# Patient Record
Sex: Female | Born: 1970 | Race: White | Hispanic: No | Marital: Married | Smoking: Never smoker
Health system: Southern US, Community
[De-identification: ages and names within clinical notes are randomized; demographics above are authoritative.]

---

## 2001-08-27 ENCOUNTER — Other Ambulatory Visit: Admission: RE | Admit: 2001-08-27 | Discharge: 2001-08-27 | Payer: Self-pay | Admitting: Gynecology

## 2003-07-17 ENCOUNTER — Encounter: Admission: RE | Admit: 2003-07-17 | Discharge: 2003-07-17 | Payer: Self-pay | Admitting: Obstetrics & Gynecology

## 2004-02-25 ENCOUNTER — Ambulatory Visit (HOSPITAL_COMMUNITY): Admission: AD | Admit: 2004-02-25 | Discharge: 2004-02-25 | Payer: Self-pay | Admitting: Obstetrics and Gynecology

## 2004-10-18 ENCOUNTER — Other Ambulatory Visit: Admission: RE | Admit: 2004-10-18 | Discharge: 2004-10-18 | Payer: Self-pay | Admitting: Obstetrics & Gynecology

## 2005-05-03 ENCOUNTER — Other Ambulatory Visit: Admission: RE | Admit: 2005-05-03 | Discharge: 2005-05-03 | Payer: Self-pay | Admitting: Obstetrics & Gynecology

## 2005-10-03 ENCOUNTER — Other Ambulatory Visit: Admission: RE | Admit: 2005-10-03 | Discharge: 2005-10-03 | Payer: Self-pay | Admitting: Obstetrics & Gynecology

## 2007-03-28 ENCOUNTER — Other Ambulatory Visit: Admission: RE | Admit: 2007-03-28 | Discharge: 2007-03-28 | Payer: Self-pay | Admitting: Obstetrics and Gynecology

## 2007-04-05 ENCOUNTER — Encounter: Admission: RE | Admit: 2007-04-05 | Discharge: 2007-04-05 | Payer: Self-pay | Admitting: Obstetrics and Gynecology

## 2007-06-29 ENCOUNTER — Encounter (INDEPENDENT_AMBULATORY_CARE_PROVIDER_SITE_OTHER): Payer: Self-pay | Admitting: Diagnostic Radiology

## 2007-06-29 ENCOUNTER — Encounter: Admission: RE | Admit: 2007-06-29 | Discharge: 2007-06-29 | Payer: Self-pay | Admitting: General Surgery

## 2008-02-12 ENCOUNTER — Other Ambulatory Visit: Admission: RE | Admit: 2008-02-12 | Discharge: 2008-02-12 | Payer: Self-pay | Admitting: Obstetrics and Gynecology

## 2008-08-05 ENCOUNTER — Other Ambulatory Visit: Admission: RE | Admit: 2008-08-05 | Discharge: 2008-08-05 | Payer: Self-pay | Admitting: Obstetrics and Gynecology

## 2008-11-25 ENCOUNTER — Emergency Department (HOSPITAL_COMMUNITY): Admission: EM | Admit: 2008-11-25 | Discharge: 2008-11-25 | Payer: Self-pay | Admitting: Family Medicine

## 2009-08-06 ENCOUNTER — Other Ambulatory Visit: Admission: RE | Admit: 2009-08-06 | Discharge: 2009-08-06 | Payer: Self-pay | Admitting: Obstetrics and Gynecology

## 2009-08-17 ENCOUNTER — Encounter: Admission: RE | Admit: 2009-08-17 | Discharge: 2009-08-17 | Payer: Self-pay | Admitting: Obstetrics and Gynecology

## 2010-02-16 ENCOUNTER — Other Ambulatory Visit: Admission: RE | Admit: 2010-02-16 | Discharge: 2010-02-16 | Payer: Self-pay | Admitting: Obstetrics and Gynecology

## 2010-07-11 ENCOUNTER — Encounter: Payer: Self-pay | Admitting: Obstetrics and Gynecology

## 2010-07-13 ENCOUNTER — Other Ambulatory Visit: Payer: Self-pay | Admitting: Surgery

## 2010-08-31 ENCOUNTER — Other Ambulatory Visit: Payer: Self-pay | Admitting: Obstetrics and Gynecology

## 2010-08-31 ENCOUNTER — Other Ambulatory Visit (HOSPITAL_COMMUNITY)
Admission: RE | Admit: 2010-08-31 | Discharge: 2010-08-31 | Disposition: A | Discharge status: Home / Self Care | Payer: BC Managed Care – PPO | Source: Ambulatory Visit | Attending: Obstetrics and Gynecology | Admitting: Obstetrics and Gynecology

## 2010-08-31 DIAGNOSIS — N6459 Other signs and symptoms in breast: Secondary | ICD-10-CM

## 2010-08-31 DIAGNOSIS — Z01419 Encounter for gynecological examination (general) (routine) without abnormal findings: Secondary | ICD-10-CM | POA: Insufficient documentation

## 2010-08-31 DIAGNOSIS — Z113 Encounter for screening for infections with a predominantly sexual mode of transmission: Secondary | ICD-10-CM | POA: Insufficient documentation

## 2010-09-07 ENCOUNTER — Ambulatory Visit
Admission: RE | Admit: 2010-09-07 | Discharge: 2010-09-07 | Disposition: A | Discharge status: Home / Self Care | Payer: BC Managed Care – PPO | Source: Ambulatory Visit | Attending: *Deleted | Admitting: *Deleted

## 2010-09-07 DIAGNOSIS — N6459 Other signs and symptoms in breast: Secondary | ICD-10-CM

## 2010-10-28 ENCOUNTER — Other Ambulatory Visit: Payer: Self-pay | Admitting: *Deleted

## 2010-10-28 ENCOUNTER — Other Ambulatory Visit: Payer: Self-pay | Admitting: Obstetrics and Gynecology

## 2010-10-28 DIAGNOSIS — N6459 Other signs and symptoms in breast: Secondary | ICD-10-CM

## 2010-11-05 NOTE — H&P (Signed)
NAME:  Theresa Flores, Theresa Flores                        ACCOUNT NO.:  [id number]   MEDICAL RECORD NO.:  [id number]                   PATIENT TYPE:  AMB   LOCATION:  [facility]                                 FACILITY:  [facility]   PHYSICIAN:  Dineen Kid. Rana Snare, M.D.                 DATE OF BIRTH:  September 04, 1970   DATE OF ADMISSION:  02/25/2004  DATE OF DISCHARGE:                                HISTORY & PHYSICAL   HISTORY OF PRESENT ILLNESS:  Theresa Flores is a 40 year old G1, P1, who was  seen in the office today by Dr. Lacretia Nicks. Varney Baas for LEEP for cervical  dysplasia.  This morning, she underwent a LEEP, went home without  complications, began developing vaginal bleeding, presented back to the  office this afternoon and had repeat cauterization of the cervix performed.  She did well through the evening, until about midnight; she passed a large  clot and began having heavy vaginal bleeding; she was also very weak and  orthostatic by the time she presented to triage, passing a moderate amount  of clots with a steady amount blood coming from the vagina and trickling  down her leg, in triage, unable to achieve hemostasis; plan to proceed back  to the operating room for further evaluation and re-cauterization of the  cervix.   MEDICATIONS:  Currently, she is on Depo-Lupron, a sleeping pill and Zyrtec.   ALLERGIES:  She is allergic to SULFA, PENICILLIN, ERYTHROMYCIN and BIAXIN,  which all cause hives and respiratory distress.   PAST MEDICAL HISTORY:  She denies any medical problems.   PHYSICAL EXAM:  VITAL SIGNS:  On physical exam, her blood pressure in triage  at 1:30 was 117/79, lying down, with a pulse of 59; when sitting, she is  116/91 with a pulse of 78 and standing, 126/75 with a pulse of 92,  consistent with orthostatic hypotension.  PELVIC:  On physical exam, she has a moderate amount of clots coming from  the vagina.  After placing a speculum, approximately 300 mL of clots were  evacuated from the  vagina.  An attempt to visualize the cervix was minimally  successful due to the large amount of bright red oozing from the cervix.  Attempts at swabbing this with Monsel-soaked 4 x 4's were unsuccessful.  Because of the moderate cervical bleeding, we plan to proceed to the  operating room.  I placed one 4 x 4 with Monsel-soaked against the cervix  and two 4 x 4's, packing the vagina, removed the speculum.   LABORATORY DATA:  A STAT hemoglobin was performed, hemoglobin was 12.5.   IMPRESSION AND PLAN:  Postoperative cervical bleeding from loop  electrosurgical excision procedure, unable to control this with conservative  measures in triage.  Plan cauterization of the cervix in the operating room.  The risks and benefits were discussed at length, which include, but not  limited to, risks of infection, bleeding, damage to  the cervix, possibility  of not being able to alleviate the bleeding or need for re-cauterization;  risks associated with anesthesia and blood loss; she does give her informed  consent.                                               Dineen Kid Rana Snare, M.D.    DCL/MEDQ  D:  02/25/2004  T:  02/25/2004  Job:  [id number]

## 2010-11-05 NOTE — Op Note (Signed)
NAME:  Theresa Flores, Theresa Flores                        ACCOUNT NO.:  [id number]   MEDICAL RECORD NO.:  [id number]                   PATIENT TYPE:  AMB   LOCATION:  [facility]                                 FACILITY:  [facility]   PHYSICIAN:  Dineen Kid. Rana Snare, M.D.                 DATE OF BIRTH:  December 29, 1970   DATE OF PROCEDURE:  02/25/2004  DATE OF DISCHARGE:                                 OPERATIVE REPORT   PREOPERATIVE DIAGNOSIS:  Postoperative loop electrical excision procedure  with heavy cervical bleeding.   POSTOPERATIVE DIAGNOSIS:  Postoperative loop electrical excision procedure  with heavy cervical bleeding.   PROCEDURE:  Cauterization of the cervix.   ANESTHESIA:  Monitored anesthetic care with local by infiltration.   SURGEON:  Dineen Kid. Rana Snare, M.D.   INDICATIONS:  [name] is a 40 year old G1, P1 who underwent a LEEP  procedure today, had postoperative bleeding which required a second attempt  at cauterization.  She presents tonight with heavy vaginal bleeding, passing  clots and with blood trickling down her leg.  She was orthostatic in triage  and attempts at achieving control of the cervical bleeding were unsuccessful  in triage with conservative measures.  Plan to proceed to the operating room  for cauterization.  Risks and benefits were discussed and informed consent  was obtained.   FINDINGS AT THE TIME OF SURGERY:  Moderate cervical bleeding in a  generalized fashion.  No obvious one identifiable source.   DESCRIPTION OF PROCEDURE:  After adequate anesthesia, the patient was placed  in the dorsal lithotomy position.  She was sterilely prepped and draped.  The bladder was sterilely drained with a red rubber catheter.  A Teflon-  coated Graves speculum was placed.  A smoke evacuator was also placed.  Clots were evacuated with 4 x 4s on the sponge stick.  The cervix was  infiltrated with 1% Xylocaine with 1:100,000 epinephrine, 15 mL used.  A  Teflon-coated tenaculum was placed on  the anterior lip of the cervix.  The  eschar for the Monsel's was removed with the 4 x 4 and the base of the  cervix was thoroughly cauterized with a Bovie.  This was performed until  good, thick eschar was across the entire cervical base and all areas of  cervical bleeding had been eradicated.  The tenaculum was removed from the  cervix and reexamination of the cervix even after wiping with a 4 x 4  revealed good hemostasis.  Some Monsel's solution was applied to the top of  the eschar and the speculum was then removed and the patient was transferred  to recovery room in stable condition.  The estimated blood loss during the  procedure was 25 mL.  Previously in triage, at least 300 mL of clots had  been evacuated for a total blood loss of 325 mL.   DISPOSITION:  The patient will be discharged home and will follow up  in the  office next week.  Told to return for increased pain, fever, or bleeding.                                               Dineen Kid Rana Snare, M.D.    DCL/MEDQ  D:  02/25/2004  T:  02/25/2004  Job:  [id number]

## 2012-01-18 ENCOUNTER — Other Ambulatory Visit: Payer: Self-pay | Admitting: Obstetrics and Gynecology

## 2012-01-18 ENCOUNTER — Other Ambulatory Visit (HOSPITAL_COMMUNITY)
Admission: RE | Admit: 2012-01-18 | Discharge: 2012-01-18 | Disposition: A | Discharge status: Home / Self Care | Payer: BC Managed Care – PPO | Source: Ambulatory Visit | Attending: Obstetrics and Gynecology | Admitting: Obstetrics and Gynecology

## 2012-01-18 DIAGNOSIS — R87810 Cervical high risk human papillomavirus (HPV) DNA test positive: Secondary | ICD-10-CM | POA: Insufficient documentation

## 2012-01-18 DIAGNOSIS — Z01419 Encounter for gynecological examination (general) (routine) without abnormal findings: Secondary | ICD-10-CM | POA: Insufficient documentation

## 2012-03-12 ENCOUNTER — Other Ambulatory Visit: Payer: Self-pay | Admitting: Obstetrics and Gynecology

## 2012-03-27 ENCOUNTER — Other Ambulatory Visit: Payer: Self-pay | Admitting: Obstetrics and Gynecology

## 2012-03-27 DIAGNOSIS — Z1231 Encounter for screening mammogram for malignant neoplasm of breast: Secondary | ICD-10-CM

## 2012-04-20 ENCOUNTER — Ambulatory Visit
Admission: RE | Admit: 2012-04-20 | Discharge: 2012-04-20 | Disposition: A | Discharge status: Home / Self Care | Payer: BC Managed Care – PPO | Source: Ambulatory Visit | Attending: Obstetrics and Gynecology | Admitting: Obstetrics and Gynecology

## 2012-04-20 DIAGNOSIS — Z1231 Encounter for screening mammogram for malignant neoplasm of breast: Secondary | ICD-10-CM

## 2013-04-10 ENCOUNTER — Other Ambulatory Visit: Payer: Self-pay

## 2013-04-10 DIAGNOSIS — Z1231 Encounter for screening mammogram for malignant neoplasm of breast: Secondary | ICD-10-CM

## 2013-04-29 ENCOUNTER — Ambulatory Visit: Payer: BC Managed Care – PPO

## 2013-04-29 ENCOUNTER — Other Ambulatory Visit (HOSPITAL_COMMUNITY)
Admission: RE | Admit: 2013-04-29 | Discharge: 2013-04-29 | Disposition: A | Discharge status: Home / Self Care | Payer: BC Managed Care – PPO | Source: Ambulatory Visit | Attending: Obstetrics and Gynecology | Admitting: Obstetrics and Gynecology

## 2013-04-29 ENCOUNTER — Other Ambulatory Visit: Payer: Self-pay | Admitting: Obstetrics and Gynecology

## 2013-04-29 DIAGNOSIS — Z01419 Encounter for gynecological examination (general) (routine) without abnormal findings: Secondary | ICD-10-CM | POA: Insufficient documentation

## 2013-04-29 DIAGNOSIS — Z1151 Encounter for screening for human papillomavirus (HPV): Secondary | ICD-10-CM | POA: Insufficient documentation

## 2013-05-23 ENCOUNTER — Ambulatory Visit: Payer: BC Managed Care – PPO

## 2013-07-12 ENCOUNTER — Ambulatory Visit: Payer: BC Managed Care – PPO

## 2013-07-31 ENCOUNTER — Ambulatory Visit: Payer: BC Managed Care – PPO

## 2014-05-23 ENCOUNTER — Ambulatory Visit
Admission: RE | Admit: 2014-05-23 | Discharge: 2014-05-23 | Disposition: A | Discharge status: Home / Self Care | Payer: BC Managed Care – PPO | Source: Ambulatory Visit

## 2014-05-23 DIAGNOSIS — Z1231 Encounter for screening mammogram for malignant neoplasm of breast: Secondary | ICD-10-CM

## 2014-05-23 IMAGING — MG MM SCREENING BREAST TOMO BILATERAL
8 series · 9 of 24 positions shown · non-contrast
Comparison: Previous exam(s).

CLINICAL DATA: Screening.

EXAM:
DIGITAL SCREENING BILATERAL MAMMOGRAM WITH 3D TOMO WITH CAD

[L MLO]
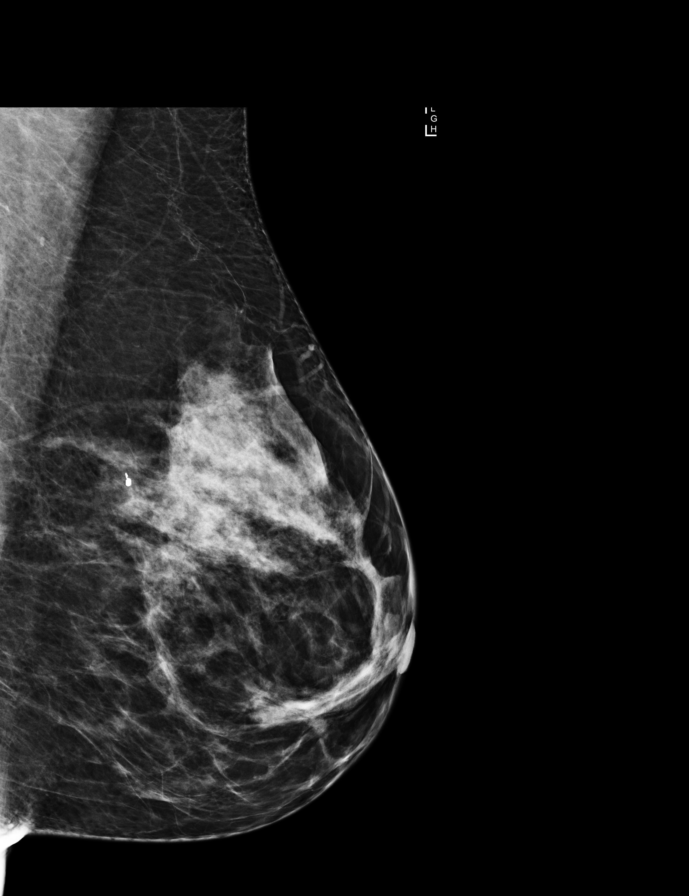

[L CC]
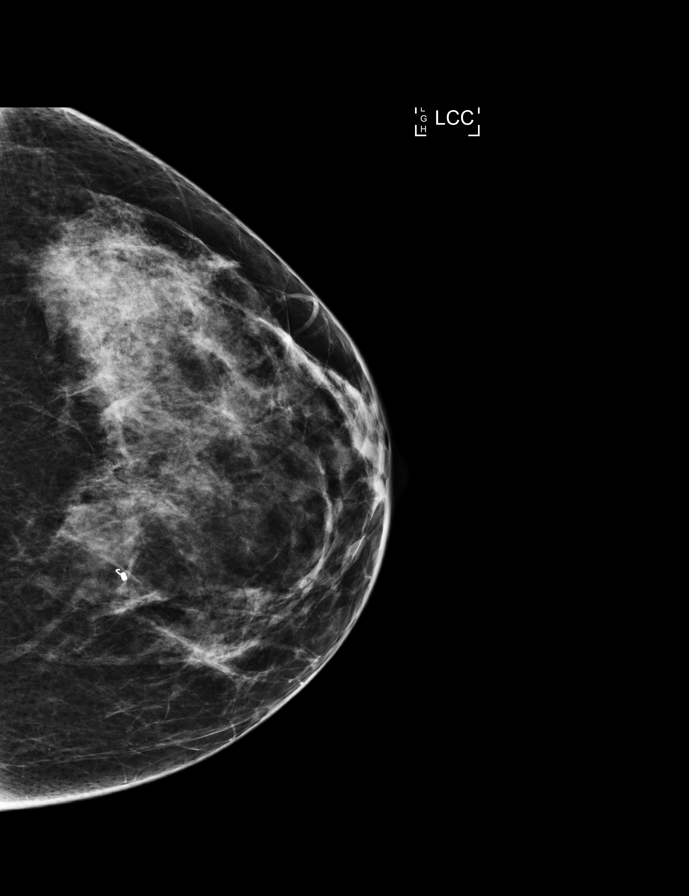

[R CC]
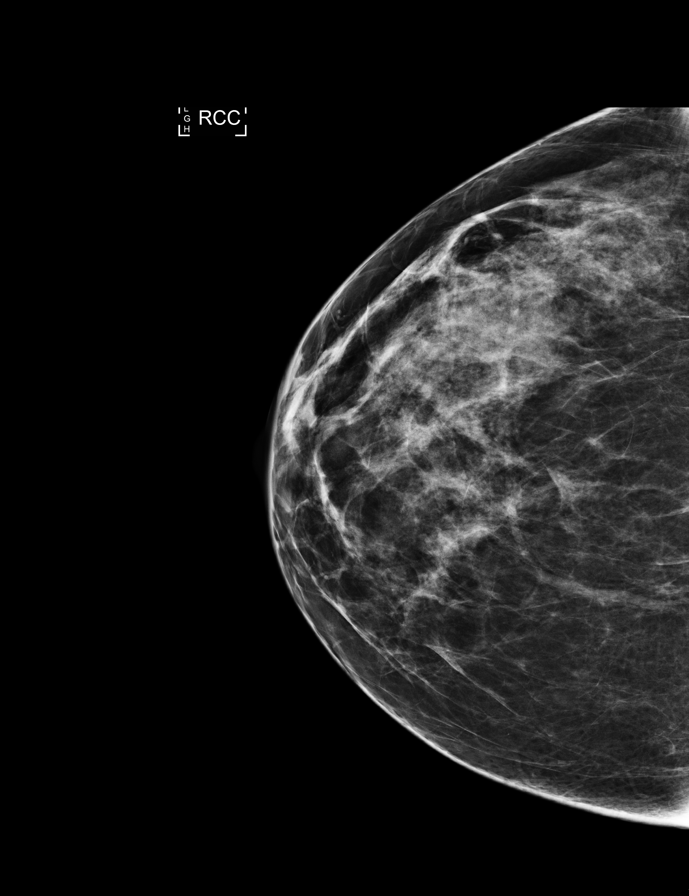

[R MLO]
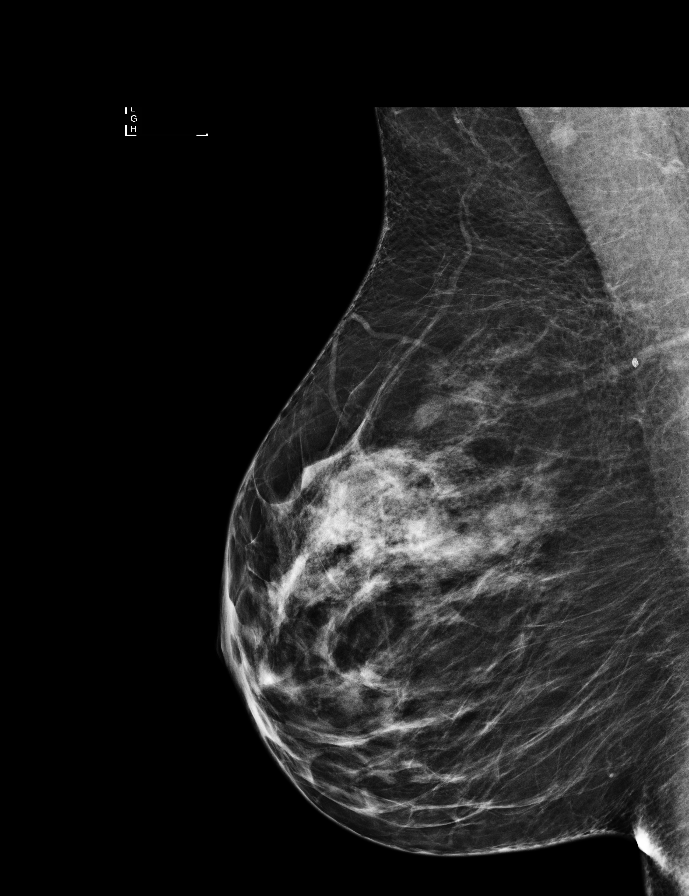

[R MLO tomo · 2 of 79 frames shown]
[frame 26/79]
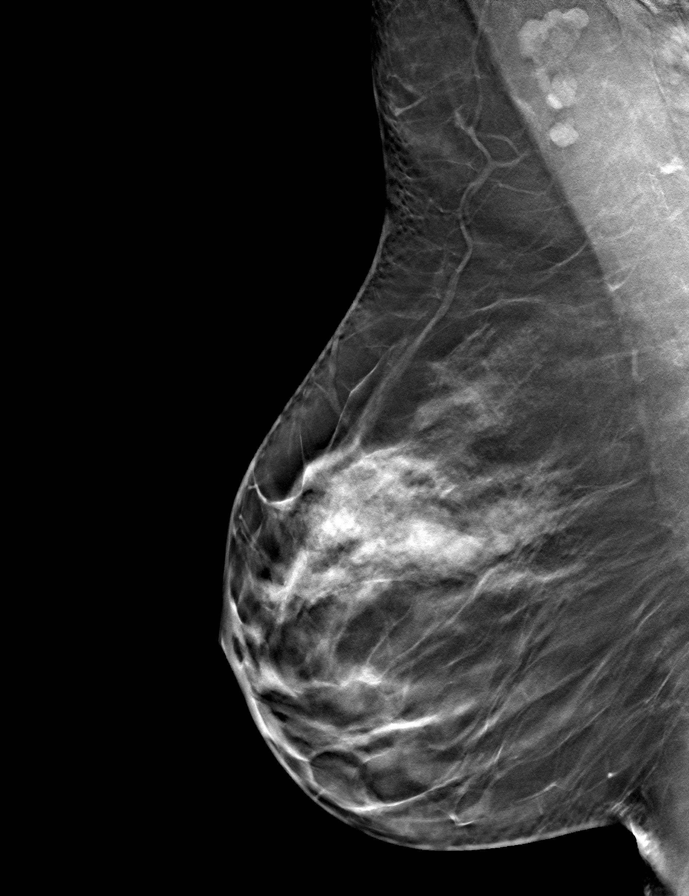
[frame 40/79]
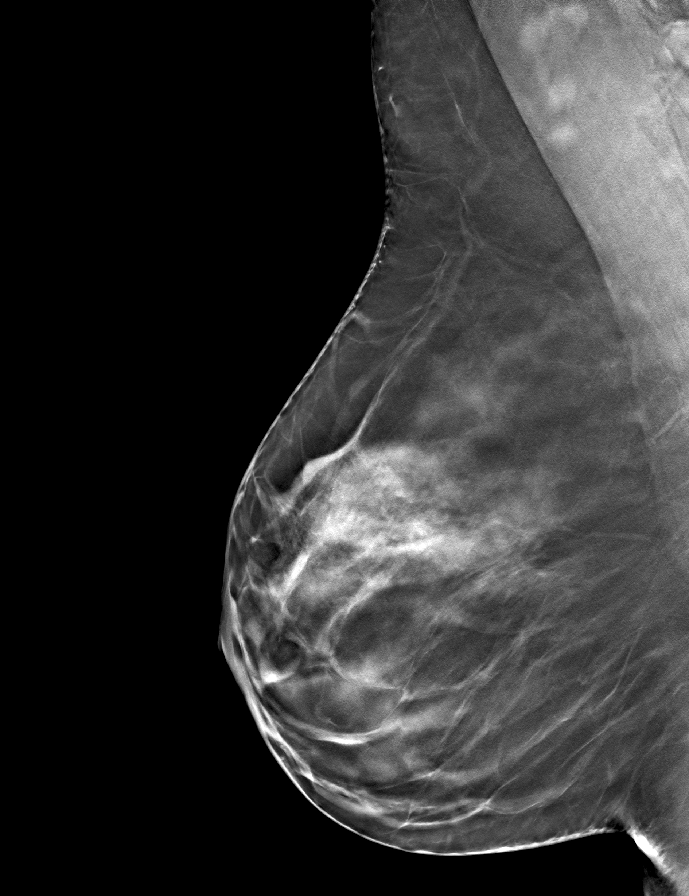

[L MLO tomo · tomo slice 38/75.0]
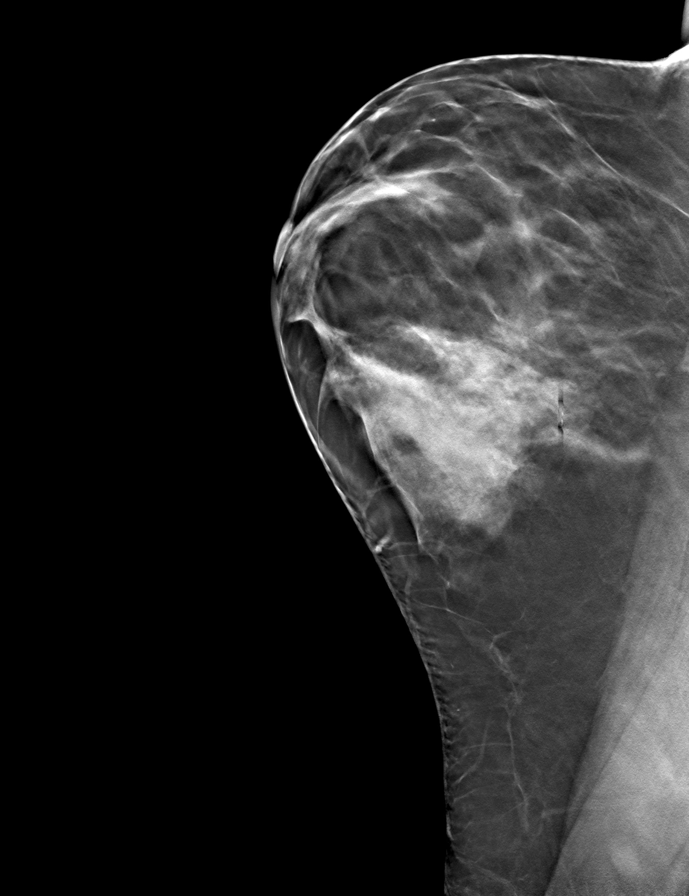

[R CC tomo · tomo slice 36/71.0]
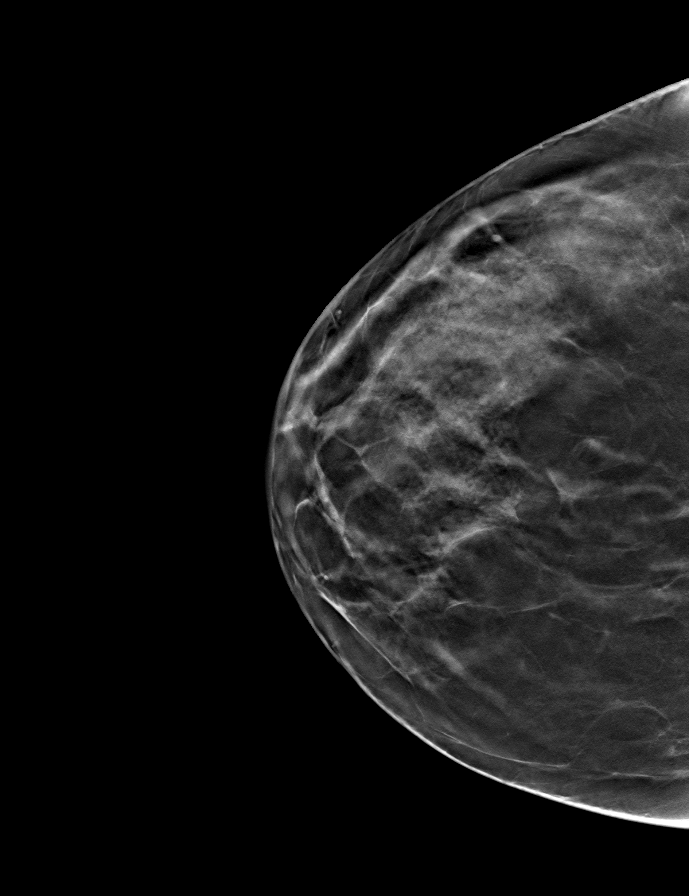

[L CC tomo · tomo slice 33/66.0]
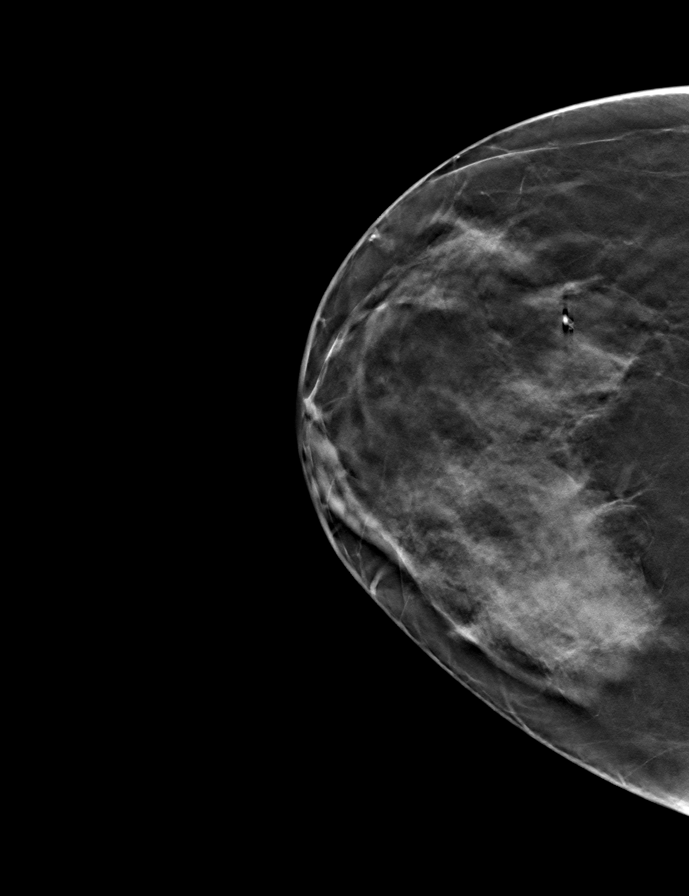

[9 of 24 positions shown; findings below may reference images not displayed]

ACR Breast Density Category b: There are scattered areas of
fibroglandular density.
FINDINGS: There are no findings suspicious for malignancy. Images were
processed with CAD.
IMPRESSION: No mammographic evidence of malignancy. A result letter of this
screening mammogram will be mailed directly to the patient.

RECOMMENDATION:
Screening mammogram in one year. (Code:[F0])

BI-RADS CATEGORY  1: Negative.

## 2015-07-22 ENCOUNTER — Other Ambulatory Visit (HOSPITAL_COMMUNITY)
Admission: RE | Admit: 2015-07-22 | Discharge: 2015-07-22 | Disposition: A | Discharge status: Home / Self Care | Payer: 59 | Source: Ambulatory Visit | Attending: Obstetrics and Gynecology | Admitting: Obstetrics and Gynecology

## 2015-07-22 ENCOUNTER — Other Ambulatory Visit: Payer: Self-pay | Admitting: Obstetrics and Gynecology

## 2015-07-22 DIAGNOSIS — Z1151 Encounter for screening for human papillomavirus (HPV): Secondary | ICD-10-CM | POA: Insufficient documentation

## 2015-07-22 DIAGNOSIS — Z01411 Encounter for gynecological examination (general) (routine) with abnormal findings: Secondary | ICD-10-CM | POA: Diagnosis present

## 2015-07-24 LAB — CYTOLOGY - PAP

## 2015-08-25 ENCOUNTER — Other Ambulatory Visit: Payer: Self-pay | Admitting: Obstetrics and Gynecology

## 2016-06-28 ENCOUNTER — Encounter (HOSPITAL_COMMUNITY): Payer: Self-pay | Admitting: *Deleted

## 2016-06-28 ENCOUNTER — Ambulatory Visit (HOSPITAL_COMMUNITY)
Admission: EM | Admit: 2016-06-28 | Discharge: 2016-06-28 | Disposition: A | Discharge status: Home / Self Care | Payer: 59 | Attending: Family Medicine | Admitting: Family Medicine

## 2016-06-28 DIAGNOSIS — R69 Illness, unspecified: Secondary | ICD-10-CM | POA: Diagnosis not present

## 2016-06-28 DIAGNOSIS — J111 Influenza due to unidentified influenza virus with other respiratory manifestations: Secondary | ICD-10-CM

## 2016-06-28 MED ORDER — IPRATROPIUM BROMIDE 0.06 % NA SOLN: 2.0000 | Freq: Four times a day (QID) | NASAL | 1 refills | Status: AC

## 2016-06-28 MED ORDER — HYDROCOD POLST-CPM POLST ER 10-8 MG/5ML PO SUER: 5.0000 mL | Freq: Two times a day (BID) | ORAL | 1 refills | Status: AC | PRN

## 2016-06-28 NOTE — ED Provider Notes (Signed)
[facility]    CSN: [id number] Arrival date & time: 06/28/16  1314     History   Chief Complaint Chief Complaint  Patient presents with  . Cough    HPI Theresa Flores is a 46 y.o. female.   The history is provided by the patient.  Cough  Cough characteristics:  Non-productive, dry and hacking Severity:  Moderate Onset quality:  Sudden Duration:  1 day Chronicity:  New Smoker: no   Context: sick contacts and upper respiratory infection   Relieved by:  None tried Worsened by:  Nothing Ineffective treatments:  None tried Associated symptoms: fever, rhinorrhea and sore throat     History reviewed. No pertinent past medical history.  There are no active problems to display for this patient.   History reviewed. No pertinent surgical history.  OB History    No data available       Home Medications    Prior to Admission medications   Medication Sig Start Date End Date Taking? Authorizing Provider  Dextromethorphan Polistirex (DELSYM PO) Take by mouth.   Yes Historical Provider, MD  Ibuprofen (ADVIL PO) Take by mouth.   Yes Historical Provider, MD  chlorpheniramine-HYDROcodone (TUSSIONEX PENNKINETIC ER) 10-8 MG/5ML SUER Take 5 mLs by mouth every 12 (twelve) hours as needed for cough. 06/28/16   Billy Fischer, MD  ipratropium (ATROVENT) 0.06 % nasal spray Place 2 sprays into both nostrils 4 (four) times daily. 06/28/16   Billy Fischer, MD    Family History History reviewed. No pertinent family history.  Social History Social History  Substance Use Topics  . Smoking status: Never Smoker  . Smokeless tobacco: Never Used  . Alcohol use Yes     Allergies   Penicillins and Sulfa antibiotics   Review of Systems Review of Systems  Constitutional: Positive for fever.  HENT: Positive for congestion, postnasal drip, rhinorrhea and sore throat.   Respiratory: Positive for cough.      Physical Exam Triage Vital Signs ED Triage Vitals  Enc  Vitals Group     BP 06/28/16 1443 118/71     Pulse Rate 06/28/16 1443 75     Resp 06/28/16 1443 18     Temp 06/28/16 1443 98.6 F (37 C)     Temp Source 06/28/16 1443 Oral     SpO2 06/28/16 1443 100 %     Weight --      Height --      Head Circumference --      Peak Flow --      Pain Score 06/28/16 1446 2     Pain Loc --      Pain Edu? --      Excl. in [place]? --    No data found.   Updated Vital Signs BP 118/71 (BP Location: Right Arm)   Pulse 75   Temp 98.6 F (37 C) (Oral)   Resp 18   LMP 06/28/2016   SpO2 100%   Visual Acuity Right Eye Distance:   Left Eye Distance:   Bilateral Distance:    Right Eye Near:   Left Eye Near:    Bilateral Near:     Physical Exam  Constitutional: She is oriented to person, place, and time. She appears well-developed and well-nourished. No distress.  HENT:  Right Ear: External ear normal.  Left Ear: External ear normal.  Nose: Nose normal.  Mouth/Throat: Oropharynx is clear and moist.  Neck: Normal range of motion.  Cardiovascular: Normal rate,  regular rhythm, normal heart sounds and intact distal pulses.   Pulmonary/Chest: Effort normal and breath sounds normal.  Lymphadenopathy:    She has no cervical adenopathy.  Neurological: She is alert and oriented to person, place, and time.  Skin: Skin is warm and dry.  Nursing note and vitals reviewed.    UC Treatments / Results  Labs (all labs ordered are listed, but only abnormal results are displayed) Labs Reviewed - No data to display  EKG  EKG Interpretation None       Radiology No results found.  Procedures Procedures (including critical care time)  Medications Ordered in UC Medications - No data to display   Initial Impression / Assessment and Plan / UC Course  I have reviewed the triage vital signs and the nursing notes.  Pertinent labs & imaging results that were available during my care of the patient were reviewed by me and considered in my medical  decision making (see chart for details).       Final Clinical Impressions(s) / UC Diagnoses   Final diagnoses:  Influenza-like illness    New Prescriptions Discharge Medication List as of 06/28/2016  3:34 PM    START taking these medications   Details  chlorpheniramine-HYDROcodone (TUSSIONEX PENNKINETIC ER) 10-8 MG/5ML SUER Take 5 mLs by mouth every 12 (twelve) hours as needed for cough., Starting Tue 06/28/2016, Print    ipratropium (ATROVENT) 0.06 % nasal spray Place 2 sprays into both nostrils 4 (four) times daily., Starting Tue 06/28/2016, Print         Billy Fischer, MD 07/17/16 2135

## 2016-06-28 NOTE — ED Triage Notes (Signed)
sorethroat       Cough       Head is   Stuffy      onnset   Of   Symptoms   Several  Days  Ago      -  Pt  Ambulated  To  Room  With a  Steady fluid     Gait         Pt is  asiiting  Upright  alsrt  And  Oriented  And  Is  Masked  And  Is  In no  Acute  Distress

## 2016-11-15 ENCOUNTER — Other Ambulatory Visit: Payer: Self-pay | Admitting: Obstetrics & Gynecology

## 2016-11-15 DIAGNOSIS — Z803 Family history of malignant neoplasm of breast: Secondary | ICD-10-CM

## 2017-01-16 ENCOUNTER — Ambulatory Visit
Admission: RE | Admit: 2017-01-16 | Discharge: 2017-01-16 | Disposition: A | Discharge status: Home / Self Care | Payer: 59 | Source: Ambulatory Visit | Attending: Obstetrics & Gynecology | Admitting: Obstetrics & Gynecology

## 2017-01-16 DIAGNOSIS — Z803 Family history of malignant neoplasm of breast: Secondary | ICD-10-CM

## 2017-01-16 MED ORDER — GADOBENATE DIMEGLUMINE 529 MG/ML IV SOLN
15.0000 mL | Freq: Once | INTRAVENOUS | Status: AC | PRN
  Administered 2017-01-16: 15 mL via INTRAVENOUS

## 2017-12-12 DIAGNOSIS — N951 Menopausal and female climacteric states: Secondary | ICD-10-CM | POA: Diagnosis not present

## 2017-12-12 DIAGNOSIS — Z01419 Encounter for gynecological examination (general) (routine) without abnormal findings: Secondary | ICD-10-CM | POA: Diagnosis not present

## 2017-12-12 DIAGNOSIS — Z800 Family history of malignant neoplasm of digestive organs: Secondary | ICD-10-CM | POA: Diagnosis not present

## 2017-12-12 DIAGNOSIS — Z1231 Encounter for screening mammogram for malignant neoplasm of breast: Secondary | ICD-10-CM | POA: Diagnosis not present

## 2017-12-12 DIAGNOSIS — Z803 Family history of malignant neoplasm of breast: Secondary | ICD-10-CM | POA: Diagnosis not present

## 2017-12-12 DIAGNOSIS — Z801 Family history of malignant neoplasm of trachea, bronchus and lung: Secondary | ICD-10-CM | POA: Diagnosis not present

## 2018-01-30 DIAGNOSIS — Z809 Family history of malignant neoplasm, unspecified: Secondary | ICD-10-CM | POA: Diagnosis not present

## 2018-04-13 DIAGNOSIS — E039 Hypothyroidism, unspecified: Secondary | ICD-10-CM | POA: Diagnosis not present

## 2018-04-18 DIAGNOSIS — E039 Hypothyroidism, unspecified: Secondary | ICD-10-CM | POA: Diagnosis not present

## 2018-04-18 DIAGNOSIS — Z23 Encounter for immunization: Secondary | ICD-10-CM | POA: Diagnosis not present

## 2018-06-04 DIAGNOSIS — E039 Hypothyroidism, unspecified: Secondary | ICD-10-CM | POA: Diagnosis not present

## 2018-06-08 DIAGNOSIS — R635 Abnormal weight gain: Secondary | ICD-10-CM | POA: Diagnosis not present

## 2018-06-08 DIAGNOSIS — E663 Overweight: Secondary | ICD-10-CM | POA: Diagnosis not present

## 2018-07-16 DIAGNOSIS — Z0000 Encounter for general adult medical examination without abnormal findings: Secondary | ICD-10-CM | POA: Diagnosis not present

## 2018-07-16 DIAGNOSIS — E039 Hypothyroidism, unspecified: Secondary | ICD-10-CM | POA: Diagnosis not present

## 2019-04-11 ENCOUNTER — Other Ambulatory Visit: Payer: Self-pay | Admitting: Obstetrics & Gynecology

## 2019-04-11 DIAGNOSIS — R928 Other abnormal and inconclusive findings on diagnostic imaging of breast: Secondary | ICD-10-CM

## 2019-04-18 ENCOUNTER — Ambulatory Visit
Admission: RE | Admit: 2019-04-18 | Discharge: 2019-04-18 | Disposition: A | Discharge status: Home / Self Care | Payer: 59 | Source: Ambulatory Visit | Attending: Obstetrics & Gynecology | Admitting: Obstetrics & Gynecology

## 2019-04-18 ENCOUNTER — Other Ambulatory Visit: Payer: Self-pay

## 2019-04-18 ENCOUNTER — Ambulatory Visit: Payer: 59

## 2019-04-18 DIAGNOSIS — R928 Other abnormal and inconclusive findings on diagnostic imaging of breast: Secondary | ICD-10-CM

## 2020-11-12 ENCOUNTER — Other Ambulatory Visit: Payer: Self-pay | Admitting: Internal Medicine

## 2020-11-12 DIAGNOSIS — E785 Hyperlipidemia, unspecified: Secondary | ICD-10-CM

## 2021-01-12 NOTE — Progress Notes (Signed)
Office Visit Note  Patient: Theresa Flores             Date of Birth: [birth year]           MRN: [id number]             PCP: Michael Boston, MD Referring: Delrae Rend, MD Visit Date: 01/25/2021 Occupation: '@GUAROCC'$ @  Subjective:  Positive SSA, dry mouth and dry eyes.   History of Present Illness: Theresa Flores is a 50 y.o. female seen in consultation per request of Dr. Buddy Duty.  According the patient she has been under care of Dr. Buddy Duty for at least last 10 years for thyroiditis.  She states about 7 years ago she started having some discomfort in her hands and at the time she was evaluated by me.  According to patient I did x-rays at the time which were unremarkable.  She states in the last 1-1/2 years she has been experiencing increased fatigue, weight gain and her thyroid functions have been fluctuating.  In May 2022 she acquired COVID-19 infection and she has been experiencing increased fatigue since then.  She is also had history of dry mouth and dry eyes for many years which has gotten worse after the COVID-19 infection.  She was evaluated by her GYN and was given estrogen patch and progesterone pills which she could not tolerate and did not notice any benefit from.  She was also evaluated by her PCP in March 2022.  At the time she had extensive labs which were consistent with positive SSA antibody, elevated TSH.  She states her TSH is normal now after her thyroid disease has been treated.  She continues to have dry mouth and dry eyes.  She also has some stiffness in her hands which she describes over the [facility] joints.  There is no history of oral ulcers, nasal ulcers, malar rash, photosensitivity, lymphadenopathy or inflammatory arthritis.  She gives history of Raynaud's phenomenon when she is very cold.  She is gravida 4, para 1, miscarriages 3 related to endometriosis.  There is no family history of autoimmune disease.  Activities of Daily Living:  Patient reports morning  stiffness for 30 minutes.   Patient Denies nocturnal pain.  Difficulty dressing/grooming: Denies Difficulty climbing stairs: Denies Difficulty getting out of chair: Denies Difficulty using hands for taps, buttons, cutlery, and/or writing: Denies  Review of Systems  Constitutional:  Positive for fatigue.  HENT:  Positive for mouth dryness. Negative for mouth sores and nose dryness.   Eyes:  Positive for dryness. Negative for pain and itching.  Respiratory:  Negative for shortness of breath and difficulty breathing.   Cardiovascular:  Negative for chest pain and palpitations.  Gastrointestinal:  Negative for blood in stool, constipation and diarrhea.  Endocrine: Negative for increased urination.  Genitourinary:  Negative for difficulty urinating.  Musculoskeletal:  Positive for joint pain, joint pain and morning stiffness. Negative for joint swelling, myalgias, muscle tenderness and myalgias.  Skin:  Positive for color change. Negative for rash, redness and sensitivity to sunlight.  Allergic/Immunologic: Negative for susceptible to infections.  Neurological:  Positive for numbness. Negative for dizziness, headaches, memory loss and weakness.  Hematological:  Negative for bruising/bleeding tendency and swollen glands.  Psychiatric/Behavioral:  Positive for depressed mood. Negative for confusion and sleep disturbance. The patient is nervous/anxious.    PMFS History:  There are no problems to display for this patient.   History reviewed. No pertinent past medical history.  Family History  Problem Relation  Age of Onset   Breast cancer Mother    Osteoporosis Mother    Thyroid disease Mother    Raynaud syndrome Mother    Heart disease Father    Healthy Son    Past Surgical History:  Procedure Laterality Date   HERNIA REPAIR     1990s   Social History   Social History Narrative   Not on file   Immunization History  Administered Date(s) Administered   PFIZER(Purple  Top)SARS-COV-2 Vaccination 08/14/2019, 09/04/2019, 03/29/2020     Objective: Vital Signs: BP 116/83 (BP Location: Right Arm, Patient Position: Sitting, Cuff Size: Normal)   Pulse 83   Resp 14   Ht 5' 5.5" (1.664 m)   Wt 161 lb 3.2 oz (73.1 kg)   LMP 06/28/2016   BMI 26.42 kg/m    Physical Exam Vitals and nursing note reviewed.  Constitutional:      Appearance: She is well-developed.  HENT:     Head: Normocephalic and atraumatic.  Eyes:     Conjunctiva/sclera: Conjunctivae normal.  Cardiovascular:     Rate and Rhythm: Normal rate and regular rhythm.     Heart sounds: Normal heart sounds.  Pulmonary:     Effort: Pulmonary effort is normal.     Breath sounds: Normal breath sounds.  Abdominal:     General: Bowel sounds are normal.     Palpations: Abdomen is soft.  Musculoskeletal:     Cervical back: Normal range of motion.  Lymphadenopathy:     Cervical: No cervical adenopathy.  Skin:    General: Skin is warm and dry.     Capillary Refill: Capillary refill takes less than 2 seconds.     Comments: No sclerodactyly, telangiectasias or defect capillary changes were noted.  Neurological:     Mental Status: She is alert and oriented to person, place, and time.  Psychiatric:        Behavior: Behavior normal.    Musculoskeletal Exam: Good range of motion.  Shoulder joints, elbow joints, wrist joints, MCPs PIPs and DIPs with good range of motion.  She had left CMC discomfort and thickening.  Hip joints, knee joints, ankles, MTPs and PIPs with good range of motion.  She had bilateral PIP and DIP thickening with no synovitis.  CDAI Exam: CDAI Score: -- Patient Global: --; Provider Global: -- Swollen: --; Tender: -- Joint Exam 01/25/2021   No joint exam has been documented for this visit   There is currently no information documented on the homunculus. Go to the Rheumatology activity and complete the homunculus joint exam.  Investigation: No additional  findings.  Imaging: No results found.  Recent Labs: No results found for: WBC, HGB, PLT, NA, K, CL, CO2, GLUCOSE, BUN, CREATININE, BILITOT, ALKPHOS, AST, ALT, PROT, ALBUMIN, CALCIUM, [place]   September 14, 2020 labs from Dr. Carver Fila as CMP LDH 283, triglycerides 160, CBC normal, TSH 6.56, hemoglobin A1c 5.4, estradiol normal, vitamin D 29.6, ANA positive no titer given, (dsDNA, RNP, Smith, SSB negative), SSA positive  Speciality Comments: No specialty comments available.  Procedures:  No procedures performed Allergies: Hydrocodone, Penicillins, and Sulfa antibiotics   Assessment / Plan:     Visit Diagnoses: Dry mouth -patient has been complaining of dry mouth and dry eyes for the last few years.  Her symptoms have been worse after she developed COVID-19 infection in May 2022.  There is no history of lymphadenopathy or parotid swelling.  Different treatment options and their side effects were discussed.  Over-the-counter products were discussed.  I will also place her on pilocarpine 5 mg p.o. 3 times daily.  Side effects of pilocarpine were discussed.  She was some labs with her today which showed positive ANA and positive SSA antibody.  We will obtain AVISE labs.  (Previous pt. August 2016: ANA-, RF-, anti-CCP-, complements WNL, anticardiolipin ab-, LA-, beta-2 glycoprotein-, dsDNA 5 and Scl-70: 1.4. ANA+)  Dry eyes-she also complains of dry eyes.  Over-the-counter products were discussed.  Raynaud's syndrome without gangrene - Previously mild.  No features of scleroderma in 2016.  She had no sclerodactyly, telangiectasias or nailbed capillary changes.  She has mild Raynaud's during the winter months.  Keeping core temperature warm was discussed.  Pain in both hands- Previous u/s negative for synovitis.  Referred to Dr. 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  Patient states that she decided against surgery.  She continues to have discomfort in her bilateral  hands especially her left CMC joint.  I offered left CMC brace which she declined.  Joint protection muscle strengthening was discussed.  A handout on hand muscle strengthening exercises were discussed.  Dyslipidemia  Hashimoto's thyroiditis-followed by Dr. Buddy Duty.  Reactive depression and  History of endometriosis  Orders: No orders of the defined types were placed in this encounter.  Meds ordered this encounter  Medications   pilocarpine (SALAGEN) 5 MG tablet    Sig: Take 1 tablet (5 mg total) by mouth 3 (three) times daily as needed.    Dispense:  90 tablet    Refill:  2      Follow-Up Instructions: Return for Osteoarthritis, sicca.   Bo Merino, MD  Note - This record has been created using Editor, commissioning.  Chart creation errors have been sought, but may not always  have been located. Such creation errors do not reflect on  the standard of medical care.

## 2021-01-25 ENCOUNTER — Ambulatory Visit (INDEPENDENT_AMBULATORY_CARE_PROVIDER_SITE_OTHER): Payer: 59 | Admitting: Rheumatology

## 2021-01-25 ENCOUNTER — Other Ambulatory Visit: Payer: Self-pay

## 2021-01-25 ENCOUNTER — Encounter: Payer: Self-pay | Admitting: Rheumatology

## 2021-01-25 VITALS — BP 116/83 | HR 83 | Resp 14 | Ht 65.5 in | Wt 161.2 lb

## 2021-01-25 DIAGNOSIS — E063 Autoimmune thyroiditis: Secondary | ICD-10-CM

## 2021-01-25 DIAGNOSIS — R682 Dry mouth, unspecified: Secondary | ICD-10-CM

## 2021-01-25 DIAGNOSIS — I7300 Raynaud's syndrome without gangrene: Secondary | ICD-10-CM | POA: Diagnosis not present

## 2021-01-25 DIAGNOSIS — M79642 Pain in left hand: Secondary | ICD-10-CM

## 2021-01-25 DIAGNOSIS — H04123 Dry eye syndrome of bilateral lacrimal glands: Secondary | ICD-10-CM | POA: Diagnosis not present

## 2021-01-25 DIAGNOSIS — Z8742 Personal history of other diseases of the female genital tract: Secondary | ICD-10-CM

## 2021-01-25 DIAGNOSIS — M2550 Pain in unspecified joint: Secondary | ICD-10-CM

## 2021-01-25 DIAGNOSIS — E785 Hyperlipidemia, unspecified: Secondary | ICD-10-CM

## 2021-01-25 DIAGNOSIS — F329 Major depressive disorder, single episode, unspecified: Secondary | ICD-10-CM

## 2021-01-25 DIAGNOSIS — M79641 Pain in right hand: Secondary | ICD-10-CM

## 2021-01-25 MED ORDER — PILOCARPINE HCL 5 MG PO TABS: 5.0000 mg | ORAL_TABLET | Freq: Three times a day (TID) | ORAL | 2 refills | Status: DC | PRN

## 2021-01-25 NOTE — Patient Instructions (Signed)
Hand Exercises Hand exercises can be helpful for almost anyone. These exercises can strengthen the hands, improve flexibility and movement, and increase blood flow to the hands. These results can make work and daily tasks easier. Hand exercises can be especially helpful for people who have joint pain from arthritis or have nerve damage from overuse (carpal tunnel syndrome). These exercises can also help people who have injured a hand. Exercises Most of these hand exercises are gentle stretching and motion exercises. It is usually safe to do them often throughout the day. Warming up your hands before exercise may help to reduce stiffness. You can do this with gentle massage orby placing your hands in warm water for 10-15 minutes. It is normal to feel some stretching, pulling, tightness, or mild discomfort as you begin new exercises. This will gradually improve. Stop an exercise right away if you feel sudden, severe pain or your pain gets worse. Ask your healthcare provider which exercises are best for you. Knuckle bend or "claw" fist Stand or sit with your arm, hand, and all five fingers pointed straight up. Make sure to keep your wrist straight during the exercise. Gently bend your fingers down toward your palm until the tips of your fingers are touching the top of your palm. Keep your big knuckle straight and just bend the small knuckles in your fingers. Hold this position for __________ seconds. Straighten (extend) your fingers back to the starting position. Repeat this exercise 5-10 times with each hand. Full finger fist Stand or sit with your arm, hand, and all five fingers pointed straight up. Make sure to keep your wrist straight during the exercise. Gently bend your fingers into your palm until the tips of your fingers are touching the middle of your palm. Hold this position for __________ seconds. Extend your fingers back to the starting position, stretching every joint fully. Repeat this  exercise 5-10 times with each hand. Straight fist Stand or sit with your arm, hand, and all five fingers pointed straight up. Make sure to keep your wrist straight during the exercise. Gently bend your fingers at the big knuckle, where your fingers meet your hand, and the middle knuckle. Keep the knuckle at the tips of your fingers straight and try to touch the bottom of your palm. Hold this position for __________ seconds. Extend your fingers back to the starting position, stretching every joint fully. Repeat this exercise 5-10 times with each hand. Tabletop Stand or sit with your arm, hand, and all five fingers pointed straight up. Make sure to keep your wrist straight during the exercise. Gently bend your fingers at the big knuckle, where your fingers meet your hand, as far down as you can while keeping the small knuckles in your fingers straight. Think of forming a tabletop with your fingers. Hold this position for __________ seconds. Extend your fingers back to the starting position, stretching every joint fully. Repeat this exercise 5-10 times with each hand. Finger spread Place your hand flat on a table with your palm facing down. Make sure your wrist stays straight as you do this exercise. Spread your fingers and thumb apart from each other as far as you can until you feel a gentle stretch. Hold this position for __________ seconds. Bring your fingers and thumb tight together again. Hold this position for __________ seconds. Repeat this exercise 5-10 times with each hand. Making circles Stand or sit with your arm, hand, and all five fingers pointed straight up. Make sure to keep your   wrist straight during the exercise. Make a circle by touching the tip of your thumb to the tip of your index finger. Hold for __________ seconds. Then open your hand wide. Repeat this motion with your thumb and each finger on your hand. Repeat this exercise 5-10 times with each hand. Thumb motion Sit  with your forearm resting on a table and your wrist straight. Your thumb should be facing up toward the ceiling. Keep your fingers relaxed as you move your thumb. Lift your thumb up as high as you can toward the ceiling. Hold for __________ seconds. Bend your thumb across your palm as far as you can, reaching the tip of your thumb for the small finger (pinkie) side of your palm. Hold for __________ seconds. Repeat this exercise 5-10 times with each hand. Grip strengthening  Hold a stress ball or other soft ball in the middle of your hand. Slowly increase the pressure, squeezing the ball as much as you can without causing pain. Think of bringing the tips of your fingers into the middle of your palm. All of your finger joints should bend when doing this exercise. Hold your squeeze for __________ seconds, then relax. Repeat this exercise 5-10 times with each hand. Contact a health care provider if: Your hand pain or discomfort gets much worse when you do an exercise. Your hand pain or discomfort does not improve within 2 hours after you exercise. If you have any of these problems, stop doing these exercises right away. Do not do them again unless your health care provider says that you can. Get help right away if: You develop sudden, severe hand pain or swelling. If this happens, stop doing these exercises right away. Do not do them again unless your health care provider says that you can. This information is not intended to replace advice given to you by your health care provider. Make sure you discuss any questions you have with your healthcare provider. Document Revised: 09/27/2018 Document Reviewed: 06/07/2018 Elsevier Patient Education  2022 [employer]  

## 2021-01-27 ENCOUNTER — Telehealth: Payer: Self-pay

## 2021-01-27 NOTE — Telephone Encounter (Signed)
Patient left a voicemail checking the status of her prescription of Salagen 5 mg tablet that Dr. Estanislado Pandy was calling into [facility].  Patient states she hasn't been notified by the pharmacy that they received it.

## 2021-01-27 NOTE — Telephone Encounter (Signed)
Attempted to contact the patient and left message to advise patient prescription was sent to the pharmacy today.

## 2021-02-01 ENCOUNTER — Telehealth: Payer: Self-pay

## 2021-02-01 NOTE — Telephone Encounter (Signed)
[facility] lab order faxed.

## 2021-02-01 NOTE — Telephone Encounter (Signed)
[facility] labs called stating patient does not have paperwork.  Please fax to [phone]

## 2021-02-20 ENCOUNTER — Other Ambulatory Visit: Payer: Self-pay | Admitting: Rheumatology

## 2021-02-24 NOTE — Progress Notes (Signed)
Office Visit Note  Patient: Theresa Flores             Date of Birth: [birth year]           MRN: [id number]             PCP: Michael Boston, MD Referring: No ref. provider found Visit Date: 03/01/2021 Occupation: '@GUAROCC'$ @  Subjective:  Dry mouth and dry eyes.   History of Present Illness: Theresa Flores is a 50 y.o. female with a history of sicca symptoms and mild Raynauds symptoms.  She states that she is noticed improvement in sicca symptoms since she started taking pilocarpine.  She has been taking 1 or 2 tablets a day.  She states that when she took pilocarpine at bedtime she had increased drooling.  She has been also using eyedrops which has been helpful.  There is no history of oral ulcers, nasal ulcers, malar rash, photosensitivity, lymphadenopathy or inflammatory arthritis.  Activities of Daily Living:  Patient reports morning stiffness for 1 hour.   Patient Denies nocturnal pain.  Difficulty dressing/grooming: Denies Difficulty climbing stairs: Denies Difficulty getting out of chair: Denies Difficulty using hands for taps, buttons, cutlery, and/or writing: Denies  Review of Systems  Constitutional:  Positive for fatigue.  HENT:  Positive for mouth dryness and nose dryness. Negative for mouth sores.   Eyes:  Positive for dryness. Negative for pain and itching.  Respiratory:  Negative for shortness of breath and difficulty breathing.   Cardiovascular:  Negative for chest pain and palpitations.  Gastrointestinal:  Negative for blood in stool, constipation and diarrhea.  Endocrine: Negative for increased urination.  Genitourinary:  Negative for difficulty urinating.  Musculoskeletal:  Positive for joint pain, joint pain, myalgias, morning stiffness, muscle tenderness and myalgias. Negative for joint swelling.  Skin:  Positive for rash. Negative for color change and redness.  Allergic/Immunologic: Positive for susceptible to infections.  Neurological:   Negative for dizziness, numbness, headaches, memory loss and weakness.  Hematological:  Negative for bruising/bleeding tendency.  Psychiatric/Behavioral:  Negative for confusion.    PMFS History:  There are no problems to display for this patient.   History reviewed. No pertinent past medical history.  Family History  Problem Relation Age of Onset   Breast cancer Mother    Osteoporosis Mother    Thyroid disease Mother    Raynaud syndrome Mother    Heart disease Father    Healthy Son    Past Surgical History:  Procedure Laterality Date   HERNIA REPAIR     1990s   Social History   Social History Narrative   Not on file   Immunization History  Administered Date(s) Administered   PFIZER(Purple Top)SARS-COV-2 Vaccination 08/14/2019, 09/04/2019, 03/29/2020     Objective: Vital Signs: BP 121/80 (BP Location: Left Arm, Patient Position: Sitting, Cuff Size: Normal)   Pulse 82   Ht '5\' 6"'$  (1.676 m)   Wt 159 lb 12.8 oz (72.5 kg)   LMP 06/28/2016   BMI 25.79 kg/m    Physical Exam Vitals and nursing note reviewed.  Constitutional:      Appearance: She is well-developed.  HENT:     Head: Normocephalic and atraumatic.  Eyes:     Conjunctiva/sclera: Conjunctivae normal.  Cardiovascular:     Rate and Rhythm: Normal rate and regular rhythm.     Heart sounds: Normal heart sounds.  Pulmonary:     Effort: Pulmonary effort is normal.     Breath sounds: Normal breath sounds.  Abdominal:     General: Bowel sounds are normal.     Palpations: Abdomen is soft.  Musculoskeletal:     Cervical back: Normal range of motion.  Lymphadenopathy:     Cervical: No cervical adenopathy.  Skin:    General: Skin is warm and dry.     Capillary Refill: Capillary refill takes less than 2 seconds.  Neurological:     Mental Status: She is alert and oriented to person, place, and time.  Psychiatric:        Behavior: Behavior normal.     Musculoskeletal Exam: C-spine was in good range of  motion.  Shoulder joints, elbow joints, wrist joints, MCPs PIPs and DIPs with good range of motion with no synovitis.  Hip joints, knee joints, ankles, MTPs and PIPs with good range of motion with no synovitis.  CDAI Exam: CDAI Score: -- Patient Global: --; Provider Global: -- Swollen: --; Tender: -- Joint Exam 03/01/2021   No joint exam has been documented for this visit   There is currently no information documented on the homunculus. Go to the Rheumatology activity and complete the homunculus joint exam.  Investigation: No additional findings.  Imaging: No results found.  Recent Labs: No results found for: WBC, HGB, PLT, NA, K, CL, CO2, GLUCOSE, BUN, CREATININE, BILITOT, ALKPHOS, AST, ALT, PROT, ALBUMIN, CALCIUM, [place]  September 14, 2020 labs from Dr. Carver Fila as CMP LDH 283, triglycerides 160, CBC normal, TSH 6.56, hemoglobin A1c 5.4, estradiol normal, vitamin D 29.6, ANA positive no titer given, (dsDNA, RNP, Smith, SSB negative), SSA positive  February 01, 2021 [facility] lupus index -0.4, ANA 1: 640NS, CB-CAP E C4d positive, ENA negative, Jo 1 negative, anticardiolipin negative, beta-2 negative, phosphatidylserine negative, antihistone negative, RF negative, anti-CCP negative, anti-Carr P-, antithyroglobulin negative, anti-TPO positive.  Speciality Comments: No specialty comments available.  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  Symptoms got worse after COVID-19 infection in May 2022.  Pilocarpine was a started at the last visit.  Patient has noticed improvement in her symptoms since she has been taking pilocarpine.  She has been also using some over-the-counter products which has been helpful.  The most recent labs were discussed at length.  She has positive ANA and positive  CB-CAP E CD4.  She does not have any clinical features of lupus.  She denies any history of oral ulcers, nasal ulcers, malar rash, photosensitivity or lymphadenopathy.  There was no inflammatory arthritis on examination today.  I will repeat [facility] labs in 6 months.  I advised her to contact me in case she develops any new symptoms.  Raynaud's syndrome without gangrene - She has mild symptoms during the winter months only.  No sclerodactyly, nailbed capillary changes were noted.  No telangiectasias were noted.  Primary osteoarthritis of both hands -   She has bilateral CMC arthritis.  Joint protection was discussed.  Enchondroma of bone of hand, left - Patient was evaluated by Dr. Fredna Dow for enchondroma of the left fourth digit.  Patient is not interested in a follow-up visit at this time.  Dyslipidemia  Hashimoto's thyroiditis  Reactive depression  History of endometriosis  Orders: No orders of the defined types were placed in this encounter.  No orders of the defined types were placed in this encounter.    Follow-Up  Instructions: Return in about 6 months (around 08/29/2021) for sicca.   Bo Merino, MD  Note - This record has been created using Editor, commissioning.  Chart creation errors have been sought, but may not always  have been located. Such creation errors do not reflect on  the standard of medical care.

## 2021-03-01 ENCOUNTER — Ambulatory Visit (INDEPENDENT_AMBULATORY_CARE_PROVIDER_SITE_OTHER): Payer: 59 | Admitting: Rheumatology

## 2021-03-01 ENCOUNTER — Other Ambulatory Visit: Payer: Self-pay

## 2021-03-01 ENCOUNTER — Encounter: Payer: Self-pay | Admitting: Rheumatology

## 2021-03-01 VITALS — BP 121/80 | HR 82 | Ht 66.0 in | Wt 159.8 lb

## 2021-03-01 DIAGNOSIS — F329 Major depressive disorder, single episode, unspecified: Secondary | ICD-10-CM

## 2021-03-01 DIAGNOSIS — E063 Autoimmune thyroiditis: Secondary | ICD-10-CM

## 2021-03-01 DIAGNOSIS — Z8742 Personal history of other diseases of the female genital tract: Secondary | ICD-10-CM

## 2021-03-01 DIAGNOSIS — M3500 Sicca syndrome, unspecified: Secondary | ICD-10-CM

## 2021-03-01 DIAGNOSIS — I7300 Raynaud's syndrome without gangrene: Secondary | ICD-10-CM

## 2021-03-01 DIAGNOSIS — M19042 Primary osteoarthritis, left hand: Secondary | ICD-10-CM

## 2021-03-01 DIAGNOSIS — M19041 Primary osteoarthritis, right hand: Secondary | ICD-10-CM

## 2021-03-01 DIAGNOSIS — D1612 Benign neoplasm of short bones of left upper limb: Secondary | ICD-10-CM

## 2021-03-01 DIAGNOSIS — E785 Hyperlipidemia, unspecified: Secondary | ICD-10-CM

## 2021-06-19 ENCOUNTER — Other Ambulatory Visit: Payer: Self-pay | Admitting: Rheumatology

## 2021-06-22 NOTE — Telephone Encounter (Signed)
Next Visit: 08/30/2021  Last Visit: 03/01/2021  Last Fill: 01/25/2021  Dx: Sicca complex   Current Dose per office note on 03/01/2021: Pilocarpine was a started at the last visit  Okay to refill Pilocarpine?

## 2021-08-26 ENCOUNTER — Telehealth: Payer: Self-pay

## 2021-08-26 NOTE — Telephone Encounter (Signed)
Patient called stating Dr. Estanislado Pandy wanted her to have labwork, but lost her paperwork and requested a return call.   ?

## 2021-08-27 NOTE — Telephone Encounter (Signed)
[facility] lab orders have been placed at the front desk for pick up. I called the patient and left a message to advise.  ?

## 2021-08-30 ENCOUNTER — Ambulatory Visit: Payer: 59 | Admitting: Rheumatology

## 2021-10-21 NOTE — Progress Notes (Deleted)
   Office Visit Note  Patient: Theresa Flores             Date of Birth: January 07, 1971           MRN: [id number]             PCP: Michael Boston, MD Referring: Michael Boston, MD Visit Date: 11/04/2021 Occupation: '@GUAROCC'$ @  Subjective:  No chief complaint on file.   History of Present Illness: Theresa Flores is a 51 y.o. female ***   Activities of Daily Living:  Patient reports morning stiffness for *** {minute/hour:19697}.   Patient {ACTIONS;DENIES/REPORTS:21021675::"Denies"} nocturnal pain.  Difficulty dressing/grooming: {ACTIONS;DENIES/REPORTS:21021675::"Denies"} Difficulty climbing stairs: {ACTIONS;DENIES/REPORTS:21021675::"Denies"} Difficulty getting out of chair: {ACTIONS;DENIES/REPORTS:21021675::"Denies"} Difficulty using hands for taps, buttons, cutlery, and/or writing: {ACTIONS;DENIES/REPORTS:21021675::"Denies"}  No Rheumatology ROS completed.   PMFS History:  There are no problems to display for this patient.   No past medical history on file.  Family History  Problem Relation Age of Onset   Breast cancer Mother    Osteoporosis Mother    Thyroid disease Mother    Raynaud syndrome Mother    Heart disease Father    Healthy Son    Past Surgical History:  Procedure Laterality Date   HERNIA REPAIR     1990s   Social History   Social History Narrative   Not on file   Immunization History  Administered Date(s) Administered   PFIZER(Purple Top)SARS-COV-2 Vaccination 08/14/2019, 09/04/2019, 03/29/2020     Objective: Vital Signs: LMP 06/28/2016    Physical Exam   Musculoskeletal Exam: ***  CDAI Exam: CDAI Score: -- Patient Global: --; Provider Global: -- Swollen: --; Tender: -- Joint Exam 11/04/2021   No joint exam has been documented for this visit   There is currently no information documented on the homunculus. Go to the Rheumatology activity and complete the homunculus joint exam.  Investigation: No additional  findings.  Imaging: No results found.  Recent Labs: No results found for: WBC, HGB, PLT, NA, K, CL, CO2, GLUCOSE, BUN, CREATININE, BILITOT, ALKPHOS, AST, ALT, PROT, ALBUMIN, CALCIUM, GFRAA, QFTBGOLD, QFTBGOLDPLUS  Speciality Comments: No specialty comments available.  Procedures:  No procedures performed Allergies: Azithromycin, Hydrocodone, Penicillins, and Sulfa antibiotics   Assessment / Plan:     Visit Diagnoses: No diagnosis found.  Orders: No orders of the defined types were placed in this encounter.  No orders of the defined types were placed in this encounter.   Face-to-face time spent with patient was *** minutes. Greater than 50% of time was spent in counseling and coordination of care.  Follow-Up Instructions: No follow-ups on file.   Earnestine Mealing, CMA  Note - This record has been created using Editor, commissioning.  Chart creation errors have been sought, but may not always  have been located. Such creation errors do not reflect on  the standard of medical care.

## 2021-11-03 ENCOUNTER — Other Ambulatory Visit: Payer: Self-pay | Admitting: Physical Medicine & Rehabilitation

## 2021-11-03 DIAGNOSIS — M5416 Radiculopathy, lumbar region: Secondary | ICD-10-CM

## 2021-11-04 ENCOUNTER — Ambulatory Visit: Payer: Self-pay | Admitting: Rheumatology

## 2021-11-04 DIAGNOSIS — M3500 Sicca syndrome, unspecified: Secondary | ICD-10-CM

## 2021-11-04 DIAGNOSIS — E063 Autoimmune thyroiditis: Secondary | ICD-10-CM

## 2021-11-04 DIAGNOSIS — M19041 Primary osteoarthritis, right hand: Secondary | ICD-10-CM

## 2021-11-04 DIAGNOSIS — I7300 Raynaud's syndrome without gangrene: Secondary | ICD-10-CM

## 2021-11-04 DIAGNOSIS — F329 Major depressive disorder, single episode, unspecified: Secondary | ICD-10-CM

## 2021-11-04 DIAGNOSIS — D1612 Benign neoplasm of short bones of left upper limb: Secondary | ICD-10-CM

## 2021-11-04 DIAGNOSIS — Z8742 Personal history of other diseases of the female genital tract: Secondary | ICD-10-CM

## 2021-11-04 DIAGNOSIS — E785 Hyperlipidemia, unspecified: Secondary | ICD-10-CM

## 2021-11-05 ENCOUNTER — Other Ambulatory Visit: Payer: Self-pay | Admitting: Physician Assistant

## 2021-11-05 NOTE — Telephone Encounter (Signed)
Please schedule patient for a follow up visit. Thanks! 

## 2021-11-05 NOTE — Telephone Encounter (Signed)
Next Visit: Due March 2023. Message sent to the front to schedule patient.    Last Visit: 03/01/2021   Last Fill: 06/22/2021   Dx: Sicca complex    Current Dose per office note on 03/01/2021: Pilocarpine was a started at the last visit   Okay to refill Pilocarpine?

## 2021-11-05 NOTE — Telephone Encounter (Signed)
Patient states she will call to schedule follow up appointment after she schedules [facility] labs.

## 2021-11-08 ENCOUNTER — Ambulatory Visit (HOSPITAL_COMMUNITY)
Admission: RE | Admit: 2021-11-08 | Discharge: 2021-11-08 | Disposition: A | Discharge status: Home / Self Care | Payer: 59 | Source: Ambulatory Visit | Attending: Physical Medicine & Rehabilitation | Admitting: Physical Medicine & Rehabilitation

## 2021-11-08 DIAGNOSIS — M5416 Radiculopathy, lumbar region: Secondary | ICD-10-CM | POA: Diagnosis present

## 2021-11-17 ENCOUNTER — Encounter: Payer: Self-pay | Admitting: Rheumatology

## 2021-11-24 NOTE — Progress Notes (Deleted)
Office Visit Note  Patient: Theresa Flores             Date of Birth: 1971/04/05           MRN: [id number]             PCP: Michael Boston, MD Referring: Michael Boston, MD Visit Date: 12/07/2021 Occupation: '@GUAROCC'$ @  Subjective:  No chief complaint on file.   History of Present Illness: Theresa Flores is a 51 y.o. female ***   Activities of Daily Living:  Patient reports morning stiffness for *** {minute/hour:19697}.   Patient {ACTIONS;DENIES/REPORTS:21021675::"Denies"} nocturnal pain.  Difficulty dressing/grooming: {ACTIONS;DENIES/REPORTS:21021675::"Denies"} Difficulty climbing stairs: {ACTIONS;DENIES/REPORTS:21021675::"Denies"} Difficulty getting out of chair: {ACTIONS;DENIES/REPORTS:21021675::"Denies"} Difficulty using hands for taps, buttons, cutlery, and/or writing: {ACTIONS;DENIES/REPORTS:21021675::"Denies"}  No Rheumatology ROS completed.   PMFS History:  There are no problems to display for this patient.   No past medical history on file.  Family History  Problem Relation Age of Onset  . Breast cancer Mother   . Osteoporosis Mother   . Thyroid disease Mother   . Raynaud syndrome Mother   . Heart disease Father   . Healthy Son    Past Surgical History:  Procedure Laterality Date  . HERNIA REPAIR     1990s   Social History   Social History Narrative  . Not on file   Immunization History  Administered Date(s) Administered  . PFIZER(Purple Top)SARS-COV-2 Vaccination 08/14/2019, 09/04/2019, 03/29/2020     Objective: Vital Signs: LMP 06/28/2016    Physical Exam   Musculoskeletal Exam: ***  CDAI Exam: CDAI Score: -- Patient Global: --; Provider Global: -- Swollen: --; Tender: -- Joint Exam 12/07/2021   No joint exam has been documented for this visit   There is currently no information documented on the homunculus. Go to the Rheumatology activity and complete the homunculus joint exam.  Investigation: No additional  findings.  Imaging: MR LUMBAR SPINE WO CONTRAST  Result Date: 11/09/2021 CLINICAL DATA:  Lumbar radiculopathy, right M54.16 (ICD-10-CM). EXAM: MRI LUMBAR SPINE WITHOUT CONTRAST TECHNIQUE: Multiplanar, multisequence MR imaging of the lumbar spine was performed. No intravenous contrast was administered. COMPARISON:  None Available. FINDINGS: Segmentation:  Standard. Alignment: Minimal anterolisthesis of L4 over L5 related to facet disease. Vertebrae:  No fracture, evidence of discitis, or bone lesion. Conus medullaris and cauda equina: Conus extends to the L2 level. Conus and cauda equina appear normal. Paraspinal and other soft tissues: Negative. Disc levels: T12-L1: Shallow disc bulge. No spinal canal or neural foraminal stenosis. L1-2: Tiny central disc protrusion and mild facet degenerative changes without significant spinal canal or neural foraminal stenosis. L2-3: Mild facet degenerative changes. No spinal canal or neural foraminal stenosis. L3-4: Small left foraminal disc protrusion and mild facet degenerative changes. Mild left neural foraminal narrowing. No spinal stenosis. L4-5: Disc bulge with left foraminal annular tear, hypertrophic facet degenerative changes with bilateral ligamentum flavum redundancy resulting in mild-to-moderate spinal canal stenosis with mild narrowing of the bilateral subarticular zones and mild left neural foraminal narrowing. L5-S1: Disc bulge with superimposed left foraminal disc protrusion impinging on the left L5 nerve root at the neural foramen. Moderate facet degenerative changes. Mild spinal canal stenosis with narrowing of the left subarticular zone, mild right and severe left neural foraminal narrowing. IMPRESSION: 1. Left foraminal disc protrusion at L5-S1 impinging on the left L5 nerve root. 2. Advanced facet degenerative changes at L4-5 resulting in mild-to-moderate spinal canal stenosis at this level. Electronically Signed   By: Roylene Reason  Sindy Messing M.D.    On: 11/09/2021 08:17    Recent Labs: No results found for: WBC, HGB, PLT, NA, K, CL, CO2, GLUCOSE, BUN, CREATININE, BILITOT, ALKPHOS, AST, ALT, PROT, ALBUMIN, CALCIUM, GFRAA, QFTBGOLD, QFTBGOLDPLUS  Speciality Comments: No specialty comments available.  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  No orders of the defined types were placed in this encounter.   Face-to-face time spent with patient was *** minutes. Greater than 50% of time was spent in counseling and coordination of care.  Follow-Up Instructions: No follow-ups on file.   Theresa Neas, PA-C  Note - This record has been created using Dragon software.  Chart creation errors have been sought, but may not always  have been located. Such creation errors do not reflect on  the standard of medical care.

## 2021-12-07 ENCOUNTER — Ambulatory Visit: Payer: Self-pay | Admitting: Physician Assistant

## 2021-12-07 DIAGNOSIS — M19041 Primary osteoarthritis, right hand: Secondary | ICD-10-CM

## 2021-12-07 DIAGNOSIS — I7300 Raynaud's syndrome without gangrene: Secondary | ICD-10-CM

## 2021-12-07 DIAGNOSIS — M3500 Sicca syndrome, unspecified: Secondary | ICD-10-CM

## 2021-12-07 DIAGNOSIS — E063 Autoimmune thyroiditis: Secondary | ICD-10-CM

## 2021-12-07 DIAGNOSIS — E785 Hyperlipidemia, unspecified: Secondary | ICD-10-CM

## 2021-12-07 DIAGNOSIS — F329 Major depressive disorder, single episode, unspecified: Secondary | ICD-10-CM

## 2021-12-07 DIAGNOSIS — D1612 Benign neoplasm of short bones of left upper limb: Secondary | ICD-10-CM

## 2021-12-07 DIAGNOSIS — Z8742 Personal history of other diseases of the female genital tract: Secondary | ICD-10-CM

## 2021-12-09 NOTE — Progress Notes (Unsigned)
Office Visit Note  Patient: Theresa Flores             Date of Birth: 1971-05-01           MRN: [id number]             PCP: Michael Boston, MD Referring: Michael Boston, MD Visit Date: 12/23/2021 Occupation: '@GUAROCC'$ @  Subjective:  Discuss AVISE results   History of Present Illness: Theresa Flores is a 51 y.o. female with history of sicca complex and osteoarthritis.  Patient presents today to discuss AVISE results from 11/18/2021.  Patient was last seen in the office on 03/01/2021.  She continues to have ongoing sicca symptoms which have been tolerable overall.  She has been taking pilocarpine 5 mg twice daily which has improved her mouth dryness.  She uses eyedrops as needed for symptomatic relief.  She has been seeing the dentist every 6 months and sees the ophthalmologist on a yearly basis.  She denies any sores in her mouth or nose.  She has not had any rashes or any photosensitivity.  She tries to avoid direct sun exposure.  She experiences intermittent symptoms of Raynaud's especially with exposure to cold.  Her symptoms are typically worse seasonally.  She denies any digital ulcerations.  She experiences intermittent pain and stiffness in both hands.  Her symptoms are worse first in the morning.  She denies any swelling in her hands currently.  She has been taking meloxicam 15 mg daily and gabapentin 300 mg 3 times daily prescribed by Dr. Thedore Mins for her lower back.  Patient reports that she had MRI of the lumbar spine which revealed several prolapsed disc.  She has had epidural injections x2.  She states overall her symptoms have improved.  She states the meloxicam is also been helping with her hand pain.      Activities of Daily Living:  Patient reports morning stiffness for 30 minutes.   Patient Denies nocturnal pain.  Difficulty dressing/grooming: Denies Difficulty climbing stairs: Denies Difficulty getting out of chair: Denies Difficulty using hands for taps,  buttons, cutlery, and/or writing: Denies  Review of Systems  Constitutional:  Positive for fatigue.  HENT:  Positive for mouth dryness and nose dryness. Negative for mouth sores.   Eyes:  Positive for itching and dryness. Negative for pain.  Respiratory:  Negative for shortness of breath and difficulty breathing.   Cardiovascular:  Negative for chest pain and palpitations.  Gastrointestinal:  Negative for blood in stool, constipation and diarrhea.  Endocrine: Negative for increased urination.  Genitourinary:  Negative for difficulty urinating.  Musculoskeletal:  Positive for joint pain, joint pain, myalgias and myalgias. Negative for joint swelling, morning stiffness and muscle tenderness.  Skin:  Positive for color change. Negative for rash and redness.  Allergic/Immunologic: Negative for susceptible to infections.  Neurological:  Negative for dizziness, numbness, headaches, memory loss and weakness.  Hematological:  Negative for bruising/bleeding tendency.  Psychiatric/Behavioral:  Negative for confusion.     PMFS History:  There are no problems to display for this patient.   History reviewed. No pertinent past medical history.  Family History  Problem Relation Age of Onset   Breast cancer Mother    Osteoporosis Mother    Thyroid disease Mother    Raynaud syndrome Mother    Heart disease Father    Healthy Son    Past Surgical History:  Procedure Laterality Date   HERNIA REPAIR     1990s   Social History  Social History Narrative   Not on file   Immunization History  Administered Date(s) Administered   PFIZER(Purple Top)SARS-COV-2 Vaccination 08/14/2019, 09/04/2019, 03/29/2020     Objective: Vital Signs: BP 107/75 (BP Location: Left Arm, Patient Position: Sitting, Cuff Size: Normal)   Pulse 76   Ht '5\' 6"'$  (1.676 m)   Wt 143 lb 3.2 oz (65 kg)   LMP 06/28/2016   BMI 23.11 kg/m    Physical Exam Vitals and nursing note reviewed.  Constitutional:       Appearance: She is well-developed.  HENT:     Head: Normocephalic and atraumatic.  Eyes:     Conjunctiva/sclera: Conjunctivae normal.  Cardiovascular:     Rate and Rhythm: Normal rate and regular rhythm.     Heart sounds: Normal heart sounds.  Pulmonary:     Effort: Pulmonary effort is normal.     Breath sounds: Normal breath sounds.  Abdominal:     General: Bowel sounds are normal.     Palpations: Abdomen is soft.  Musculoskeletal:     Cervical back: Normal range of motion.  Skin:    General: Skin is warm and dry.     Capillary Refill: Capillary refill takes less than 2 seconds.  Neurological:     Mental Status: She is alert and oriented to person, place, and time.  Psychiatric:        Behavior: Behavior normal.      Musculoskeletal Exam: C-spine has good range of motion.  Shoulder joints, elbow joints, wrist joints, MCPs, PIPs, DIPs have good range of motion with no synovitis.  PIP and DIP thickening consistent with osteoarthritis of both hands.  Some tenderness over the left CMC joint.  Complete fist formation bilaterally.  Hip joints have good range of motion with no groin pain.  Knee joints have good range of motion with no warmth or effusion.  Ankle joints have good range of motion with no tenderness or joint swelling.  No tenderness over MTP joints.  CDAI Exam: CDAI Score: -- Patient Global: --; Provider Global: -- Swollen: --; Tender: -- Joint Exam 12/23/2021   No joint exam has been documented for this visit   There is currently no information documented on the homunculus. Go to the Rheumatology activity and complete the homunculus joint exam.  Investigation: No additional findings.  Imaging: No results found.  Recent Labs: No results found for: "WBC", "HGB", "PLT", "NA", "K", "CL", "CO2", "GLUCOSE", "BUN", "CREATININE", "BILITOT", "ALKPHOS", "AST", "ALT", "PROT", "ALBUMIN", "CALCIUM", "GFRAA", "QFTBGOLD", "QFTBGOLDPLUS"  Speciality Comments: No specialty  comments available.  Procedures:  No procedures performed Allergies: Azithromycin, Hydrocodone, Penicillins, and Sulfa antibiotics   Assessment / Plan:     Visit Diagnoses: Sicca complex ([place]) - H/o dry mouth and dry eyes.   Labs ordered by Dr. Carver Fila on 09/14/20 ANA positive no titer given, (dsDNA, RNP, Smith, SSB negative), SSA positive AVISE 02/01/21: lupus index -0.4, ANA 1: 640NS, CB-CAP E C4d positive, ENA negative, Jo 1 negative, anticardiolipin negative, beta-2 negative, phosphatidylserine negative, antihistone negative, RF negative, anti-CCP negative, anti-Carr P-, antithyroglobulin negative, anti-TPO positive. AVISE results from 11/18/21 were reviewed today in the office: index is equivocal, ANA 1: 640 NH, anti-U1 RNP equivocal, anti-Ro 60 equivocal, antithyroglobulin antibody positive, antithyroid peroxidase positive.  Patient was last seen in the office on 03/01/2021 at which time she had noticed improvement since initiating pilocarpine.  She remains on pilocarpine 5 mg twice daily and is tolerating it without any side effects.  She has not had  any oral or nasal ulcerations.  She experiences intermittent symptoms of Raynaud's with exposure to cold temperatures.  She has no digital ulcerations or signs of sclerodactyly on examination today.  She is not exhibiting any other signs or symptoms of systemic lupus at this time. AVISE results from 11/18/2021 were reviewed with the patient today in detail.  All questions were addressed.  Recommend repeating AVISE results in 6 months.  Discussed signs and symptoms to monitor for closely.  She will notify us if she develops any new or worsening symptoms.  She will follow-up in the office in 6 to 7 months to discuss results at that time.  Raynaud's syndrome without gangrene: She experiences intermittent symptoms of Raynaud's.  No signs of sclerodactyly or digital ulcerations were noted on examination today.  Her symptoms are most severe during the winter  months.  Discussed the importance of avoiding triggers.  She was advised to notify us if she develops any new or worsening symptoms.  Primary osteoarthritis of both hands: She has PIP and DIP thickening consistent with osteoarthritis of both hands.  Tenderness over the left CMC joint.  No synovitis was noted on examination today.  Complete fist formation noted bilaterally.  Discussed the importance of joint protection and muscle strengthening.  She was advised to notify us if she develops increased joint pain or joint swelling.  Other medical conditions are listed as follows:   Enchondroma of bone of hand, left - Previously evaluated by Dr. Fredna Dow for enchondroma of the left fourth digit.    Dyslipidemia  Hashimoto's thyroiditis  Reactive depression  History of endometriosis  Orders: No orders of the defined types were placed in this encounter.  No orders of the defined types were placed in this encounter.   Follow-Up Instructions: Return for Sicca complex, Osteoarthritis.   Ofilia Neas, PA-C  Note - This record has been created using Dragon software.  Chart creation errors have been sought, but may not always  have been located. Such creation errors do not reflect on  the standard of medical care.

## 2021-12-23 ENCOUNTER — Encounter: Payer: Self-pay | Admitting: Physician Assistant

## 2021-12-23 ENCOUNTER — Ambulatory Visit (INDEPENDENT_AMBULATORY_CARE_PROVIDER_SITE_OTHER): Payer: 59 | Admitting: Physician Assistant

## 2021-12-23 VITALS — BP 107/75 | HR 76 | Ht 66.0 in | Wt 143.2 lb

## 2021-12-23 DIAGNOSIS — I7300 Raynaud's syndrome without gangrene: Secondary | ICD-10-CM

## 2021-12-23 DIAGNOSIS — M19042 Primary osteoarthritis, left hand: Secondary | ICD-10-CM

## 2021-12-23 DIAGNOSIS — F329 Major depressive disorder, single episode, unspecified: Secondary | ICD-10-CM

## 2021-12-23 DIAGNOSIS — M19041 Primary osteoarthritis, right hand: Secondary | ICD-10-CM

## 2021-12-23 DIAGNOSIS — E785 Hyperlipidemia, unspecified: Secondary | ICD-10-CM

## 2021-12-23 DIAGNOSIS — Z8742 Personal history of other diseases of the female genital tract: Secondary | ICD-10-CM

## 2021-12-23 DIAGNOSIS — D1612 Benign neoplasm of short bones of left upper limb: Secondary | ICD-10-CM

## 2021-12-23 DIAGNOSIS — E063 Autoimmune thyroiditis: Secondary | ICD-10-CM

## 2021-12-23 DIAGNOSIS — M3500 Sicca syndrome, unspecified: Secondary | ICD-10-CM | POA: Diagnosis not present

## 2022-06-24 NOTE — Progress Notes (Deleted)
   Office Visit Note  Patient: Theresa Flores             Date of Birth: 11-11-1970           MRN: [id number]             PCP: Michael Boston, MD Referring: Michael Boston, MD Visit Date: 07/06/2022 Occupation: @GUAROCC$ @  Subjective:  No chief complaint on file.   History of Present Illness: [name] is a 52 y.o. female ***     Activities of Daily Living:  Patient reports morning stiffness for *** {minute/hour:19697}.   Patient {ACTIONS;DENIES/REPORTS:21021675::"Denies"} nocturnal pain.  Difficulty dressing/grooming: {ACTIONS;DENIES/REPORTS:21021675::"Denies"} Difficulty climbing stairs: {ACTIONS;DENIES/REPORTS:21021675::"Denies"} Difficulty getting out of chair: {ACTIONS;DENIES/REPORTS:21021675::"Denies"} Difficulty using hands for taps, buttons, cutlery, and/or writing: {ACTIONS;DENIES/REPORTS:21021675::"Denies"}  No Rheumatology ROS completed.   PMFS History:  There are no problems to display for this patient.   No past medical history on file.  Family History  Problem Relation Age of Onset   Breast cancer Mother    Osteoporosis Mother    Thyroid disease Mother    Raynaud syndrome Mother    Heart disease Father    Healthy Son    Past Surgical History:  Procedure Laterality Date   HERNIA REPAIR     1990s   Social History   Social History Narrative   Not on file   Immunization History  Administered Date(s) Administered   PFIZER(Purple Top)SARS-COV-2 Vaccination 08/14/2019, 09/04/2019, 03/29/2020     Objective: Vital Signs: LMP 06/28/2016    Physical Exam   Musculoskeletal Exam: ***  CDAI Exam: CDAI Score: -- Patient Global: --; Provider Global: -- Swollen: --; Tender: -- Joint Exam 07/06/2022   No joint exam has been documented for this visit   There is currently no information documented on the homunculus. Go to the Rheumatology activity and complete the homunculus joint exam.  Investigation: No additional  findings.  Imaging: No results found.  Recent Labs: No results found for: "WBC", "HGB", "PLT", "NA", "K", "CL", "CO2", "GLUCOSE", "BUN", "CREATININE", "BILITOT", "ALKPHOS", "AST", "ALT", "PROT", "ALBUMIN", "CALCIUM", "GFRAA", "QFTBGOLD", "QFTBGOLDPLUS"  Speciality Comments: No specialty comments available.  Procedures:  No procedures performed Allergies: Azithromycin, Hydrocodone, Penicillins, and Sulfa antibiotics   Assessment / Plan:     Visit Diagnoses: No diagnosis found.  Orders: No orders of the defined types were placed in this encounter.  No orders of the defined types were placed in this encounter.   Face-to-face time spent with patient was *** minutes. Greater than 50% of time was spent in counseling and coordination of care.  Follow-Up Instructions: No follow-ups on file.   Earnestine Mealing, CMA  Note - This record has been created using Editor, commissioning.  Chart creation errors have been sought, but may not always  have been located. Such creation errors do not reflect on  the standard of medical care.

## 2022-06-27 DIAGNOSIS — L658 Other specified nonscarring hair loss: Secondary | ICD-10-CM | POA: Diagnosis not present

## 2022-06-27 DIAGNOSIS — E663 Overweight: Secondary | ICD-10-CM | POA: Diagnosis not present

## 2022-06-27 DIAGNOSIS — N926 Irregular menstruation, unspecified: Secondary | ICD-10-CM | POA: Diagnosis not present

## 2022-06-27 DIAGNOSIS — R6882 Decreased libido: Secondary | ICD-10-CM | POA: Diagnosis not present

## 2022-06-27 DIAGNOSIS — R5382 Chronic fatigue, unspecified: Secondary | ICD-10-CM | POA: Diagnosis not present

## 2022-07-06 ENCOUNTER — Ambulatory Visit: Payer: 59 | Admitting: Rheumatology

## 2022-07-06 DIAGNOSIS — E063 Autoimmune thyroiditis: Secondary | ICD-10-CM

## 2022-07-06 DIAGNOSIS — M3500 Sicca syndrome, unspecified: Secondary | ICD-10-CM

## 2022-07-06 DIAGNOSIS — F329 Major depressive disorder, single episode, unspecified: Secondary | ICD-10-CM

## 2022-07-06 DIAGNOSIS — I7300 Raynaud's syndrome without gangrene: Secondary | ICD-10-CM

## 2022-07-06 DIAGNOSIS — E785 Hyperlipidemia, unspecified: Secondary | ICD-10-CM

## 2022-07-06 DIAGNOSIS — D1612 Benign neoplasm of short bones of left upper limb: Secondary | ICD-10-CM

## 2022-07-06 DIAGNOSIS — M19041 Primary osteoarthritis, right hand: Secondary | ICD-10-CM

## 2022-07-06 DIAGNOSIS — Z8742 Personal history of other diseases of the female genital tract: Secondary | ICD-10-CM

## 2022-11-08 NOTE — Progress Notes (Deleted)
   Office Visit Note  Patient: Theresa Flores             Date of Birth: 10-Jun-1971           MRN: [id number]             PCP: Melida Quitter, MD Referring: Melida Quitter, MD Visit Date: 11/22/2022 Occupation: @GUAROCC @  Subjective:  No chief complaint on file.   History of Present Illness: Theresa Flores is a 52 y.o. female ***     Activities of Daily Living:  Patient reports morning stiffness for *** {minute/hour:19697}.   Patient {ACTIONS;DENIES/REPORTS:21021675::"Denies"} nocturnal pain.  Difficulty dressing/grooming: {ACTIONS;DENIES/REPORTS:21021675::"Denies"} Difficulty climbing stairs: {ACTIONS;DENIES/REPORTS:21021675::"Denies"} Difficulty getting out of chair: {ACTIONS;DENIES/REPORTS:21021675::"Denies"} Difficulty using hands for taps, buttons, cutlery, and/or writing: {ACTIONS;DENIES/REPORTS:21021675::"Denies"}  No Rheumatology ROS completed.   PMFS History:  There are no problems to display for this patient.   No past medical history on file.  Family History  Problem Relation Age of Onset  . Breast cancer Mother   . Osteoporosis Mother   . Thyroid disease Mother   . Raynaud syndrome Mother   . Heart disease Father   . Healthy Son    Past Surgical History:  Procedure Laterality Date  . HERNIA REPAIR     1990s   Social History   Social History Narrative  . Not on file   Immunization History  Administered Date(s) Administered  . PFIZER(Purple Top)SARS-COV-2 Vaccination 08/14/2019, 09/04/2019, 03/29/2020     Objective: Vital Signs: LMP 06/28/2016    Physical Exam   Musculoskeletal Exam: ***  CDAI Exam: CDAI Score: -- Patient Global: --; Provider Global: -- Swollen: --; Tender: -- Joint Exam 11/22/2022   No joint exam has been documented for this visit   There is currently no information documented on the homunculus. Go to the Rheumatology activity and complete the homunculus joint exam.  Investigation: No additional  findings.  Imaging: No results found.  Recent Labs: No results found for: "WBC", "HGB", "PLT", "NA", "K", "CL", "CO2", "GLUCOSE", "BUN", "CREATININE", "BILITOT", "ALKPHOS", "AST", "ALT", "PROT", "ALBUMIN", "CALCIUM", "GFRAA", "QFTBGOLD", "QFTBGOLDPLUS"  Speciality Comments: No specialty comments available.  Procedures:  No procedures performed Allergies: Azithromycin, Hydrocodone, Penicillins, and Sulfa antibiotics   Assessment / Plan:     Visit Diagnoses: No diagnosis found.  Orders: No orders of the defined types were placed in this encounter.  No orders of the defined types were placed in this encounter.   Face-to-face time spent with patient was *** minutes. Greater than 50% of time was spent in counseling and coordination of care.  Follow-Up Instructions: No follow-ups on file.   Ellen Henri, CMA  Note - This record has been created using Animal nutritionist.  Chart creation errors have been sought, but may not always  have been located. Such creation errors do not reflect on  the standard of medical care.

## 2022-11-22 ENCOUNTER — Ambulatory Visit: Payer: 59 | Admitting: Rheumatology

## 2022-11-22 DIAGNOSIS — Z8742 Personal history of other diseases of the female genital tract: Secondary | ICD-10-CM

## 2022-11-22 DIAGNOSIS — M19041 Primary osteoarthritis, right hand: Secondary | ICD-10-CM

## 2022-11-22 DIAGNOSIS — E063 Autoimmune thyroiditis: Secondary | ICD-10-CM

## 2022-11-22 DIAGNOSIS — F329 Major depressive disorder, single episode, unspecified: Secondary | ICD-10-CM

## 2022-11-22 DIAGNOSIS — D1612 Benign neoplasm of short bones of left upper limb: Secondary | ICD-10-CM

## 2022-11-22 DIAGNOSIS — M3500 Sicca syndrome, unspecified: Secondary | ICD-10-CM

## 2022-11-22 DIAGNOSIS — E785 Hyperlipidemia, unspecified: Secondary | ICD-10-CM

## 2022-11-22 DIAGNOSIS — I7300 Raynaud's syndrome without gangrene: Secondary | ICD-10-CM

## 2022-12-19 DIAGNOSIS — M5416 Radiculopathy, lumbar region: Secondary | ICD-10-CM | POA: Diagnosis not present

## 2023-01-23 DIAGNOSIS — M5416 Radiculopathy, lumbar region: Secondary | ICD-10-CM | POA: Diagnosis not present

## 2023-02-07 DIAGNOSIS — Z13820 Encounter for screening for osteoporosis: Secondary | ICD-10-CM | POA: Diagnosis not present

## 2023-02-07 DIAGNOSIS — Z1231 Encounter for screening mammogram for malignant neoplasm of breast: Secondary | ICD-10-CM | POA: Diagnosis not present

## 2023-02-07 DIAGNOSIS — R319 Hematuria, unspecified: Secondary | ICD-10-CM | POA: Diagnosis not present

## 2023-02-07 DIAGNOSIS — Z6822 Body mass index (BMI) 22.0-22.9, adult: Secondary | ICD-10-CM | POA: Diagnosis not present

## 2023-02-07 DIAGNOSIS — Z1211 Encounter for screening for malignant neoplasm of colon: Secondary | ICD-10-CM | POA: Diagnosis not present

## 2023-02-07 DIAGNOSIS — Z01419 Encounter for gynecological examination (general) (routine) without abnormal findings: Secondary | ICD-10-CM | POA: Diagnosis not present

## 2023-02-07 DIAGNOSIS — Z79890 Hormone replacement therapy (postmenopausal): Secondary | ICD-10-CM | POA: Diagnosis not present

## 2023-02-14 DIAGNOSIS — M5416 Radiculopathy, lumbar region: Secondary | ICD-10-CM | POA: Diagnosis not present

## 2023-02-27 DIAGNOSIS — M8580 Other specified disorders of bone density and structure, unspecified site: Secondary | ICD-10-CM | POA: Diagnosis not present

## 2023-03-21 DIAGNOSIS — M3500 Sicca syndrome, unspecified: Secondary | ICD-10-CM | POA: Diagnosis not present

## 2023-03-21 DIAGNOSIS — E063 Autoimmune thyroiditis: Secondary | ICD-10-CM | POA: Diagnosis not present

## 2023-03-21 DIAGNOSIS — E039 Hypothyroidism, unspecified: Secondary | ICD-10-CM | POA: Diagnosis not present

## 2023-04-17 ENCOUNTER — Encounter: Payer: Self-pay | Admitting: Obstetrics and Gynecology

## 2023-07-03 DIAGNOSIS — E039 Hypothyroidism, unspecified: Secondary | ICD-10-CM | POA: Diagnosis not present

## 2023-10-05 DIAGNOSIS — F4320 Adjustment disorder, unspecified: Secondary | ICD-10-CM | POA: Diagnosis not present

## 2023-10-11 DIAGNOSIS — F4320 Adjustment disorder, unspecified: Secondary | ICD-10-CM | POA: Diagnosis not present

## 2023-10-18 DIAGNOSIS — F4320 Adjustment disorder, unspecified: Secondary | ICD-10-CM | POA: Diagnosis not present

## 2023-10-25 DIAGNOSIS — F4320 Adjustment disorder, unspecified: Secondary | ICD-10-CM | POA: Diagnosis not present

## 2023-11-08 DIAGNOSIS — F4320 Adjustment disorder, unspecified: Secondary | ICD-10-CM | POA: Diagnosis not present

## 2023-11-22 DIAGNOSIS — F4320 Adjustment disorder, unspecified: Secondary | ICD-10-CM | POA: Diagnosis not present

## 2023-12-04 DIAGNOSIS — F4320 Adjustment disorder, unspecified: Secondary | ICD-10-CM | POA: Diagnosis not present

## 2023-12-11 DIAGNOSIS — F4320 Adjustment disorder, unspecified: Secondary | ICD-10-CM | POA: Diagnosis not present

## 2024-03-04 DIAGNOSIS — Z1151 Encounter for screening for human papillomavirus (HPV): Secondary | ICD-10-CM | POA: Diagnosis not present

## 2024-03-04 DIAGNOSIS — Z124 Encounter for screening for malignant neoplasm of cervix: Secondary | ICD-10-CM | POA: Diagnosis not present

## 2024-04-02 DIAGNOSIS — R051 Acute cough: Secondary | ICD-10-CM | POA: Diagnosis not present

## 2024-04-02 DIAGNOSIS — U071 COVID-19: Secondary | ICD-10-CM | POA: Diagnosis not present

## 2024-05-30 DIAGNOSIS — M4316 Spondylolisthesis, lumbar region: Secondary | ICD-10-CM | POA: Diagnosis not present

## 2024-06-04 DIAGNOSIS — E063 Autoimmune thyroiditis: Secondary | ICD-10-CM | POA: Diagnosis not present

## 2024-06-11 ENCOUNTER — Other Ambulatory Visit: Payer: Self-pay | Admitting: Internal Medicine

## 2024-06-11 DIAGNOSIS — E785 Hyperlipidemia, unspecified: Secondary | ICD-10-CM
